# Patient Record
Sex: Male | Born: 1985 | Race: White | Hispanic: No | Marital: Married | Smoking: Never smoker
Health system: Southern US, Community
[De-identification: ages and names within clinical notes are randomized; demographics above are authoritative.]

## PROBLEM LIST (undated history)

## (undated) DIAGNOSIS — G43909 Migraine, unspecified, not intractable, without status migrainosus: Secondary | ICD-10-CM

## (undated) DIAGNOSIS — Z8489 Family history of other specified conditions: Secondary | ICD-10-CM

## (undated) DIAGNOSIS — E109 Type 1 diabetes mellitus without complications: Secondary | ICD-10-CM

## (undated) DIAGNOSIS — F988 Other specified behavioral and emotional disorders with onset usually occurring in childhood and adolescence: Secondary | ICD-10-CM

## (undated) HISTORY — PX: NO PAST SURGERIES: SHX2092

---
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
## 2010-02-13 ENCOUNTER — Emergency Department (HOSPITAL_COMMUNITY)
Admission: EM | Admit: 2010-02-13 | Discharge: 2010-02-14 | Disposition: A | Discharge status: Home / Self Care | Payer: BC Managed Care – PPO | Attending: Emergency Medicine | Admitting: Emergency Medicine

## 2010-02-13 DIAGNOSIS — J3489 Other specified disorders of nose and nasal sinuses: Secondary | ICD-10-CM | POA: Insufficient documentation

## 2010-02-13 DIAGNOSIS — R51 Headache: Secondary | ICD-10-CM | POA: Insufficient documentation

## 2010-02-13 DIAGNOSIS — R110 Nausea: Secondary | ICD-10-CM | POA: Insufficient documentation

## 2010-03-08 ENCOUNTER — Emergency Department (HOSPITAL_COMMUNITY)
Admission: EM | Admit: 2010-03-08 | Discharge: 2010-03-08 | Disposition: A | Discharge status: Home / Self Care | Payer: BC Managed Care – PPO | Attending: Emergency Medicine | Admitting: Emergency Medicine

## 2010-03-08 DIAGNOSIS — R220 Localized swelling, mass and lump, head: Secondary | ICD-10-CM | POA: Insufficient documentation

## 2010-03-08 DIAGNOSIS — T394X5A Adverse effect of antirheumatics, not elsewhere classified, initial encounter: Secondary | ICD-10-CM | POA: Insufficient documentation

## 2010-03-08 DIAGNOSIS — E119 Type 2 diabetes mellitus without complications: Secondary | ICD-10-CM | POA: Insufficient documentation

## 2010-08-08 ENCOUNTER — Encounter: Payer: Self-pay | Admitting: Family Medicine

## 2012-02-21 ENCOUNTER — Other Ambulatory Visit: Payer: Self-pay | Admitting: Physician Assistant

## 2012-02-21 ENCOUNTER — Ambulatory Visit
Admission: RE | Admit: 2012-02-21 | Discharge: 2012-02-21 | Disposition: A | Discharge status: Home / Self Care | Payer: 59 | Source: Ambulatory Visit | Attending: Physician Assistant | Admitting: Physician Assistant

## 2012-02-21 DIAGNOSIS — M79672 Pain in left foot: Secondary | ICD-10-CM

## 2012-02-21 DIAGNOSIS — W19XXXA Unspecified fall, initial encounter: Secondary | ICD-10-CM

## 2012-02-21 DIAGNOSIS — M25572 Pain in left ankle and joints of left foot: Secondary | ICD-10-CM

## 2012-05-22 ENCOUNTER — Other Ambulatory Visit: Payer: Self-pay | Admitting: Family Medicine

## 2012-08-24 ENCOUNTER — Other Ambulatory Visit: Payer: Self-pay | Admitting: Physician Assistant

## 2012-10-30 ENCOUNTER — Telehealth: Payer: Self-pay | Admitting: Family Medicine

## 2012-10-30 NOTE — Telephone Encounter (Signed)
FYI

## 2012-10-30 NOTE — Telephone Encounter (Signed)
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 Cislo.  States that he is not following proper instruction for his Lantis and his FBS. Patient did not understand instructions that were given. FBS are all over the place. Please be aware .

## 2012-11-01 NOTE — Telephone Encounter (Signed)
Send to PCP.

## 2012-11-02 ENCOUNTER — Other Ambulatory Visit: Payer: Self-pay | Admitting: Physician Assistant

## 2012-11-02 NOTE — Telephone Encounter (Signed)
Pt called.  Wants early morning or late afternoon appt.  First available is Tuesday Oct 28 430 pm

## 2012-11-02 NOTE — Telephone Encounter (Signed)
Patient NTBS, bring in sugar readings.

## 2012-11-02 NOTE — Telephone Encounter (Signed)
Has appt 10/28.  One month supply sent

## 2012-11-09 ENCOUNTER — Encounter (HOSPITAL_COMMUNITY): Payer: Self-pay | Admitting: Emergency Medicine

## 2012-11-09 ENCOUNTER — Emergency Department (HOSPITAL_COMMUNITY)
Admission: EM | Admit: 2012-11-09 | Discharge: 2012-11-09 | Disposition: A | Discharge status: Home / Self Care | Payer: Managed Care, Other (non HMO) | Attending: Emergency Medicine | Admitting: Emergency Medicine

## 2012-11-09 DIAGNOSIS — L240 Irritant contact dermatitis due to detergents: Secondary | ICD-10-CM | POA: Insufficient documentation

## 2012-11-09 DIAGNOSIS — E119 Type 2 diabetes mellitus without complications: Secondary | ICD-10-CM | POA: Insufficient documentation

## 2012-11-09 DIAGNOSIS — R21 Rash and other nonspecific skin eruption: Secondary | ICD-10-CM

## 2012-11-09 DIAGNOSIS — Z79899 Other long term (current) drug therapy: Secondary | ICD-10-CM | POA: Insufficient documentation

## 2012-11-09 DIAGNOSIS — T7840XA Allergy, unspecified, initial encounter: Secondary | ICD-10-CM

## 2012-11-09 MED ORDER — PREDNISONE 20 MG PO TABS: ORAL_TABLET | ORAL | Status: DC

## 2012-11-09 MED ORDER — PREDNISONE 50 MG PO TABS
60.0000 mg | ORAL_TABLET | Freq: Once | ORAL | Status: AC
  Administered 2012-11-09: 60 mg via ORAL
  Filled 2012-11-09 (×2): qty 1

## 2012-11-09 NOTE — ED Provider Notes (Signed)
CSN: [id number]     Arrival date & time 11/09/12  2120 History   First MD Initiated Contact with Patient 11/09/12 2133    Scribed for Enid Skeens, MD, the patient was seen in room APA14/APA14. This chart was scribed by Lewanda Rife, ED scribe. Patient's care was started at 9:55 PM  Chief Complaint  Patient presents with  . Allergic Reaction   (Consider location/radiation/quality/duration/timing/severity/associated sxs/prior Treatment) HPI HPI Comments: Clifford Whitney is a 27 y.o. male who presents to the Emergency Department complaining of worsening rash onset last night. Reports associated new soap exposure Denies associated new medications, tick exposure   Past Medical History  Diagnosis Date  . Diabetes mellitus    History reviewed. No pertinent past surgical history. History reviewed. No pertinent family history. History  Substance Use Topics  . Smoking status: Never Smoker   . Smokeless tobacco: Not on file  . Alcohol Use: Yes    Review of Systems  Allergies  Aspirin and Ibuprofen  Home Medications   Current Outpatient Rx  Name  Route  Sig  Dispense  Refill  . glipiZIDE (GLUCOTROL XL) 2.5 MG 24 hr tablet   Oral   Take 2.5 mg by mouth daily.            BP 120/68  Pulse 68  Temp(Src) 97.5 F (36.4 C) (Oral)  Resp 18  Ht 5\' 9"  (1.753 m)  Wt 160 lb (72.576 kg)  BMI 23.62 kg/m2  SpO2 100% Physical Exam  Nursing note and vitals reviewed. Constitutional: He is oriented to person, place, and time. He appears well-developed and well-nourished.  HENT:  Head: Normocephalic and atraumatic.  Mouth/Throat: Oropharynx is clear and moist.  No signs of angioedema, no tongue swelling, no lip swelling, and airway patent   Eyes: Conjunctivae are normal. Right eye exhibits no discharge. Left eye exhibits no discharge.  Neck: Normal range of motion. Neck supple. No tracheal deviation present.  Cardiovascular: Normal rate and regular rhythm.   No murmur  heard. Pulmonary/Chest: Effort normal and breath sounds normal. No respiratory distress. He has no wheezes. He has no rales.  Abdominal: Soft. He exhibits no distension. There is no tenderness. There is no guarding.  Musculoskeletal: He exhibits no edema.  Neurological: He is alert and oriented to person, place, and time.  Skin: Skin is warm. Rash noted. No petechiae and no purpura noted. Rash is maculopapular and urticarial.  Psychiatric: He has a normal mood and affect.    ED Course  Procedures (including critical care time) Labs Review Labs Reviewed  GLUCOSE, CAPILLARY - Abnormal; Notable for the following:    Glucose-Capillary 349 (*)    All other components within normal limits   Imaging Review No results found.  EKG Interpretation   None       MDM   1. Allergic reaction, initial encounter   2. Rash    I personally performed the services described in this documentation, which was scribed in my presence. The recorded information has been reviewed and is accurate.  Well appearing.  No anaphylaxis.   Prednisone/ benadryl prn  Results and differential diagnosis were discussed with the patient. Close follow up outpatient was discussed, patient comfortable with the plan.   Diagnosis: Allergic reaction  Enid Skeens, MD 11/12/12 2241

## 2012-11-09 NOTE — ED Notes (Signed)
Allergic reaction to safeguard soap, Onset last pm. Urticarial rash, No sob. Took benadryl 50 mg 730p

## 2012-11-09 NOTE — ED Notes (Signed)
Total body rash started yesterday, improved after benadryl, then reoccurred this afternoon.

## 2012-11-10 ENCOUNTER — Ambulatory Visit: Payer: Self-pay | Admitting: Family Medicine

## 2012-11-16 ENCOUNTER — Ambulatory Visit (INDEPENDENT_AMBULATORY_CARE_PROVIDER_SITE_OTHER): Payer: Managed Care, Other (non HMO) | Admitting: Family Medicine

## 2012-11-16 ENCOUNTER — Encounter: Payer: Self-pay | Admitting: Family Medicine

## 2012-11-16 VITALS — BP 130/78 | HR 72 | Temp 97.6°F | Resp 16 | Wt 166.0 lb

## 2012-11-16 DIAGNOSIS — IMO0001 Reserved for inherently not codable concepts without codable children: Secondary | ICD-10-CM

## 2012-11-16 NOTE — Progress Notes (Signed)
  Subjective:    Patient ID: Clifford Whitney, male    DOB: [birth year], 27 y.o.   MRN: [id number]  HPI Patient is currently on Lantus 30 units SQ qday for IDDM.  He has not been checking sugars at all until recently.  Reportedly the sugars are ranging <120 in the morning and less than 200 in the evening.  However, he has been in a custody battle and separation so he has not been seriously controlling his sugars until the last few weeks.  He denies hypoglycemia.   Past Medical History  Diagnosis Date  . Diabetes mellitus    Current Outpatient Prescriptions on File Prior to Visit  Medication Sig Dispense Refill  . insulin glargine (LANTUS) 100 UNIT/ML injection Inject 30 Units into the skin every morning.       Marland Kitchen lisinopril (PRINIVIL,ZESTRIL) 10 MG tablet Take 10 mg by mouth daily.      . metFORMIN (GLUCOPHAGE) 1000 MG tablet Take 1,000 mg by mouth 2 (two) times daily.      . pioglitazone (ACTOS) 45 MG tablet Take 45 mg by mouth daily.      . pravastatin (PRAVACHOL) 40 MG tablet Take 40 mg by mouth daily.       No current facility-administered medications on file prior to visit.   Allergies  Allergen Reactions  . Aspirin Swelling    Reaction: Facial swelling  . Ibuprofen Swelling    Reaction: Facial swelling   History   Social History  . Marital Status: Legally Separated    Spouse Name: N/A    Number of Children: N/A  . Years of Education: N/A   Occupational History  . Not on file.   Social History Main Topics  . Smoking status: Never Smoker   . Smokeless tobacco: Not on file  . Alcohol Use: Yes  . Drug Use: No  . Sexual Activity: Not on file   Other Topics Concern  . Not on file   Social History Narrative  . No narrative on file      Review of Systems  All other systems reviewed and are negative.       Objective:   Physical Exam  Vitals reviewed. Cardiovascular: Normal rate, regular rhythm and normal heart sounds.   No murmur heard. Pulmonary/Chest: Effort  normal and breath sounds normal. No respiratory distress. He has no wheezes. He has no rales. He exhibits no tenderness.  Abdominal: Soft. Bowel sounds are normal. He exhibits no distension. There is no tenderness. There is no rebound and no guarding.          Assessment & Plan:  1. Type II or unspecified type diabetes mellitus without mention of complication, uncontrolled Check HgA1c.  Goal is less than 7.  He is to check FBS and 2 hr PPS for the next week and then allow me to review.  I will likely add novolog 7 units with supper/meals depending on what his 2 hr pps are especially if his FBS are less than 120. - COMPLETE METABOLIC PANEL WITH GFR - Hemoglobin A1c

## 2012-11-17 LAB — COMPLETE METABOLIC PANEL WITH GFR
Albumin: 4.5 g/dL (ref 3.5–5.2)
Alkaline Phosphatase: 60 U/L (ref 39–117)
BUN: 17 mg/dL (ref 6–23)
Calcium: 10.5 mg/dL (ref 8.4–10.5)
Creat: 0.74 mg/dL (ref 0.50–1.35)
GFR, Est Non African American: >89 mL/min
Glucose, Bld: 173 mg/dL — ABNORMAL HIGH (ref 70–99)
Potassium: 4.8 mEq/L (ref 3.5–5.3)
Sodium: 134 mEq/L — ABNORMAL LOW (ref 135–145)
Total Protein: 7.4 g/dL (ref 6.0–8.3)

## 2012-11-17 LAB — HEMOGLOBIN A1C
Hgb A1c MFr Bld: 12.9 % — ABNORMAL HIGH (ref <5.7)
Mean Plasma Glucose: 324 mg/dL — ABNORMAL HIGH (ref <117)

## 2012-11-19 ENCOUNTER — Other Ambulatory Visit: Payer: Self-pay | Admitting: Family Medicine

## 2012-11-24 ENCOUNTER — Other Ambulatory Visit: Payer: Self-pay | Admitting: Physician Assistant

## 2013-01-12 ENCOUNTER — Telehealth: Payer: Self-pay | Admitting: Family Medicine

## 2013-01-12 NOTE — Telephone Encounter (Signed)
Pt is wanting a meter for his  blood sugar (glucometer) [facility] on [address] Call back number is [phone]

## 2013-01-13 MED ORDER — ONETOUCH ULTRA 2 W/DEVICE KIT: PACK | Status: DC

## 2013-01-13 NOTE — Telephone Encounter (Signed)
Sent in rx for new meter and supplies

## 2013-02-18 ENCOUNTER — Other Ambulatory Visit: Payer: Self-pay | Admitting: Physician Assistant

## 2013-02-19 ENCOUNTER — Encounter: Payer: Self-pay | Admitting: Family Medicine

## 2013-02-19 NOTE — Telephone Encounter (Signed)
Medication refill for one time only.  Patient needs to be seen.  Letter sent for patient to call and schedule 

## 2013-03-08 ENCOUNTER — Other Ambulatory Visit: Payer: Self-pay | Admitting: Family Medicine

## 2013-03-08 MED ORDER — METFORMIN HCL 1000 MG PO TABS: 1000.0000 mg | ORAL_TABLET | Freq: Two times a day (BID) | ORAL | Status: DC

## 2013-03-08 NOTE — Telephone Encounter (Signed)
Rx Refilled  

## 2013-04-29 ENCOUNTER — Other Ambulatory Visit: Payer: Self-pay | Admitting: Family Medicine

## 2013-04-29 MED ORDER — LISINOPRIL 10 MG PO TABS: ORAL_TABLET | ORAL | Status: DC

## 2013-04-29 NOTE — Telephone Encounter (Signed)
Rx Refilled  

## 2013-05-01 ENCOUNTER — Other Ambulatory Visit: Payer: Self-pay | Admitting: Family Medicine

## 2013-06-14 ENCOUNTER — Other Ambulatory Visit: Payer: Self-pay | Admitting: Family Medicine

## 2013-06-14 ENCOUNTER — Encounter: Payer: Self-pay | Admitting: Family Medicine

## 2013-06-14 MED ORDER — PIOGLITAZONE HCL 45 MG PO TABS: 45.0000 mg | ORAL_TABLET | Freq: Every day | ORAL | Status: DC

## 2013-06-14 MED ORDER — LISINOPRIL 10 MG PO TABS: 10.0000 mg | ORAL_TABLET | Freq: Every day | ORAL | Status: DC

## 2013-06-14 MED ORDER — PRAVASTATIN SODIUM 40 MG PO TABS: 40.0000 mg | ORAL_TABLET | Freq: Every day | ORAL | Status: DC

## 2013-06-14 NOTE — Telephone Encounter (Signed)
Rx Refilled and will send letter to schedule appt 

## 2013-08-10 ENCOUNTER — Inpatient Hospital Stay (HOSPITAL_COMMUNITY): Payer: Managed Care, Other (non HMO)

## 2013-08-10 ENCOUNTER — Ambulatory Visit (INDEPENDENT_AMBULATORY_CARE_PROVIDER_SITE_OTHER): Payer: Managed Care, Other (non HMO) | Admitting: Family Medicine

## 2013-08-10 ENCOUNTER — Encounter: Payer: Self-pay | Admitting: Family Medicine

## 2013-08-10 ENCOUNTER — Inpatient Hospital Stay (HOSPITAL_COMMUNITY)
Admission: EM | Admit: 2013-08-10 | Discharge: 2013-08-13 | DRG: 638 | Disposition: A | Discharge status: Home / Self Care | Payer: Managed Care, Other (non HMO) | Attending: Internal Medicine | Admitting: Internal Medicine

## 2013-08-10 ENCOUNTER — Encounter (HOSPITAL_COMMUNITY): Payer: Self-pay | Admitting: Emergency Medicine

## 2013-08-10 VITALS — BP 100/76 | HR 100 | Temp 96.7°F | Resp 16 | Ht 69.0 in | Wt 145.0 lb

## 2013-08-10 DIAGNOSIS — E872 Acidosis, unspecified: Secondary | ICD-10-CM | POA: Diagnosis present

## 2013-08-10 DIAGNOSIS — E1165 Type 2 diabetes mellitus with hyperglycemia: Secondary | ICD-10-CM

## 2013-08-10 DIAGNOSIS — E0810 Diabetes mellitus due to underlying condition with ketoacidosis without coma: Secondary | ICD-10-CM

## 2013-08-10 DIAGNOSIS — E1110 Type 2 diabetes mellitus with ketoacidosis without coma: Secondary | ICD-10-CM | POA: Diagnosis present

## 2013-08-10 DIAGNOSIS — E1310 Other specified diabetes mellitus with ketoacidosis without coma: Secondary | ICD-10-CM

## 2013-08-10 DIAGNOSIS — Z23 Encounter for immunization: Secondary | ICD-10-CM

## 2013-08-10 DIAGNOSIS — IMO0002 Reserved for concepts with insufficient information to code with codable children: Secondary | ICD-10-CM

## 2013-08-10 DIAGNOSIS — Z91199 Patient's noncompliance with other medical treatment and regimen due to unspecified reason: Secondary | ICD-10-CM

## 2013-08-10 DIAGNOSIS — E1069 Type 1 diabetes mellitus with other specified complication: Secondary | ICD-10-CM

## 2013-08-10 DIAGNOSIS — E1065 Type 1 diabetes mellitus with hyperglycemia: Secondary | ICD-10-CM

## 2013-08-10 DIAGNOSIS — E1010 Type 1 diabetes mellitus with ketoacidosis without coma: Principal | ICD-10-CM | POA: Diagnosis present

## 2013-08-10 DIAGNOSIS — Z794 Long term (current) use of insulin: Principal | ICD-10-CM

## 2013-08-10 DIAGNOSIS — R634 Abnormal weight loss: Secondary | ICD-10-CM | POA: Diagnosis present

## 2013-08-10 DIAGNOSIS — E1011 Type 1 diabetes mellitus with ketoacidosis with coma: Secondary | ICD-10-CM

## 2013-08-10 DIAGNOSIS — Z9119 Patient's noncompliance with other medical treatment and regimen: Secondary | ICD-10-CM

## 2013-08-10 DIAGNOSIS — E860 Dehydration: Secondary | ICD-10-CM

## 2013-08-10 DIAGNOSIS — IMO0001 Reserved for inherently not codable concepts without codable children: Secondary | ICD-10-CM

## 2013-08-10 HISTORY — DX: Family history of other specified conditions: Z84.89

## 2013-08-10 HISTORY — DX: Migraine, unspecified, not intractable, without status migrainosus: G43.909

## 2013-08-10 HISTORY — DX: Type 1 diabetes mellitus without complications: E10.9

## 2013-08-10 HISTORY — DX: Other specified behavioral and emotional disorders with onset usually occurring in childhood and adolescence: F98.8

## 2013-08-10 LAB — CBC WITH DIFFERENTIAL/PLATELET
BASOS PCT: 0 % (ref 0–1)
Basophils Absolute: 0 10*3/uL (ref 0.0–0.1)
EOS ABS: 0 10*3/uL (ref 0.0–0.7)
EOS PCT: 0 % (ref 0–5)
HEMATOCRIT: 48.6 % (ref 39.0–52.0)
Hemoglobin: 16.9 g/dL (ref 13.0–17.0)
Lymphocytes Relative: 17 % (ref 12–46)
Lymphs Abs: 1.6 10*3/uL (ref 0.7–4.0)
MCH: 29.9 pg (ref 26.0–34.0)
MCHC: 34.8 g/dL (ref 30.0–36.0)
MCV: 86 fL (ref 78.0–100.0)
MONO ABS: 0.5 10*3/uL (ref 0.1–1.0)
Monocytes Relative: 6 % (ref 3–12)
Neutro Abs: 7.1 10*3/uL (ref 1.7–7.7)
Neutrophils Relative %: 77 % (ref 43–77)
Platelets: 354 10*3/uL (ref 150–400)
RBC: 5.65 MIL/uL (ref 4.22–5.81)
RDW: 12.8 % (ref 11.5–15.5)
WBC: 9.3 10*3/uL (ref 4.0–10.5)

## 2013-08-10 LAB — CBG MONITORING, ED
Glucose-Capillary: 249 mg/dL — ABNORMAL HIGH (ref 70–99)
Glucose-Capillary: 276 mg/dL — ABNORMAL HIGH (ref 70–99)

## 2013-08-10 LAB — COMPREHENSIVE METABOLIC PANEL
ALBUMIN: 4 g/dL (ref 3.5–5.2)
ALT: 12 U/L (ref 0–53)
ANION GAP: 30 — AB (ref 5–15)
AST: 11 U/L (ref 0–37)
Alkaline Phosphatase: 102 U/L (ref 39–117)
BUN: 11 mg/dL (ref 6–23)
CO2: 8 mEq/L — CL (ref 19–32)
CREATININE: 0.6 mg/dL (ref 0.50–1.35)
Calcium: 8.5 mg/dL (ref 8.4–10.5)
Chloride: 96 mEq/L (ref 96–112)
GFR calc non Af Amer: >90 mL/min (ref >90)
Glucose, Bld: 291 mg/dL — ABNORMAL HIGH (ref 70–99)
Potassium: 4.3 mEq/L (ref 3.7–5.3)
Sodium: 134 mEq/L — ABNORMAL LOW (ref 137–147)
TOTAL PROTEIN: 7.8 g/dL (ref 6.0–8.3)
Total Bilirubin: 0.3 mg/dL (ref 0.3–1.2)

## 2013-08-10 LAB — BASIC METABOLIC PANEL
Anion gap: 30 — ABNORMAL HIGH (ref 5–15)
Anion gap: 22 — ABNORMAL HIGH (ref 5–15)
BUN: 10 mg/dL (ref 6–23)
BUN: 9 mg/dL (ref 6–23)
CALCIUM: 8.4 mg/dL (ref 8.4–10.5)
CHLORIDE: 100 meq/L (ref 96–112)
CO2: 7 mEq/L — CL (ref 19–32)
CO2: 10 meq/L — AB (ref 19–32)
CREATININE: 0.58 mg/dL (ref 0.50–1.35)
Calcium: 8.2 mg/dL — ABNORMAL LOW (ref 8.4–10.5)
Chloride: 97 mEq/L (ref 96–112)
Creatinine, Ser: 0.69 mg/dL (ref 0.50–1.35)
GFR calc Af Amer: >90 mL/min (ref >90)
GFR calc Af Amer: >90 mL/min (ref >90)
GLUCOSE: 273 mg/dL — AB (ref 70–99)
GLUCOSE: 250 mg/dL — AB (ref 70–99)
Potassium: 4.4 mEq/L (ref 3.7–5.3)
Potassium: 3.9 mEq/L (ref 3.7–5.3)
SODIUM: 134 meq/L — AB (ref 137–147)
SODIUM: 132 meq/L — AB (ref 137–147)

## 2013-08-10 LAB — I-STAT ARTERIAL BLOOD GAS, ED
ACID-BASE DEFICIT: 21 mmol/L — AB (ref 0.0–2.0)
Bicarbonate: 6.1 mEq/L — ABNORMAL LOW (ref 20.0–24.0)
O2 Saturation: 98 %
TCO2: 7 mmol/L (ref 0–100)
pCO2 arterial: 17.9 mmHg — CL (ref 35.0–45.0)
pH, Arterial: 7.141 — CL (ref 7.350–7.450)
pO2, Arterial: 122 mmHg — ABNORMAL HIGH (ref 80.0–100.0)

## 2013-08-10 LAB — GLUCOSE, CAPILLARY
GLUCOSE-CAPILLARY: 208 mg/dL — AB (ref 70–99)
Glucose-Capillary: 236 mg/dL — ABNORMAL HIGH (ref 70–99)
Glucose-Capillary: 242 mg/dL — ABNORMAL HIGH (ref 70–99)

## 2013-08-10 LAB — URINE MICROSCOPIC-ADD ON

## 2013-08-10 LAB — RAPID URINE DRUG SCREEN, HOSP PERFORMED
Amphetamines: NOT DETECTED
BARBITURATES: NOT DETECTED
BENZODIAZEPINES: NOT DETECTED
COCAINE: NOT DETECTED
OPIATES: NOT DETECTED
TETRAHYDROCANNABINOL: NOT DETECTED

## 2013-08-10 LAB — GLUCOSE, FINGERSTICK (STAT): GLUCOSE, FINGERSTICK: 236 mg/dL — AB (ref 70–99)

## 2013-08-10 LAB — URINALYSIS, ROUTINE W REFLEX MICROSCOPIC
Bilirubin Urine: NEGATIVE
Ketones, ur: >80 mg/dL — AB
LEUKOCYTES UA: NEGATIVE
NITRITE: NEGATIVE
PROTEIN: 100 mg/dL — AB
Specific Gravity, Urine: 1.026 (ref 1.005–1.030)
UROBILINOGEN UA: 0.2 mg/dL (ref 0.0–1.0)
pH: 5 (ref 5.0–8.0)

## 2013-08-10 LAB — KETONES, QUALITATIVE

## 2013-08-10 LAB — HEMOGLOBIN A1C, FINGERSTICK: Hgb A1C (fingerstick): >14 % — ABNORMAL HIGH (ref <5.7)

## 2013-08-10 LAB — I-STAT CG4 LACTIC ACID, ED: LACTIC ACID, VENOUS: 1.44 mmol/L (ref 0.5–2.2)

## 2013-08-10 LAB — LIPASE, BLOOD: Lipase: 37 U/L (ref 11–59)

## 2013-08-10 LAB — MRSA PCR SCREENING: MRSA BY PCR: NEGATIVE

## 2013-08-10 MED ORDER — SODIUM CHLORIDE 0.9 % IV SOLN
Freq: Once | INTRAVENOUS | Status: AC
  Administered 2013-08-10: 18:00:00 via INTRAVENOUS

## 2013-08-10 MED ORDER — DEXTROSE 5 % IV SOLN
INTRAVENOUS | Status: DC
  Filled 2013-08-10 (×2): qty 100

## 2013-08-10 MED ORDER — ACETAMINOPHEN 650 MG RE SUPP: 650.0000 mg | Freq: Four times a day (QID) | RECTAL | Status: DC | PRN

## 2013-08-10 MED ORDER — SODIUM CHLORIDE 0.9 % IV SOLN
INTRAVENOUS | Status: DC
  Administered 2013-08-10: 20:00:00 via INTRAVENOUS

## 2013-08-10 MED ORDER — INSULIN REGULAR HUMAN 100 UNIT/ML IJ SOLN
INTRAMUSCULAR | Status: DC
  Administered 2013-08-10: 2.2 [IU]/h via INTRAVENOUS
  Administered 2013-08-11: 2.5 [IU]/h via INTRAVENOUS
  Administered 2013-08-11: 2.2 [IU]/h via INTRAVENOUS
  Administered 2013-08-11 (×2): 2.7 [IU]/h via INTRAVENOUS
  Administered 2013-08-11: 3.9 [IU]/h via INTRAVENOUS
  Administered 2013-08-11: 7.3 [IU]/h via INTRAVENOUS
  Administered 2013-08-11: 5.8 [IU]/h via INTRAVENOUS
  Administered 2013-08-11: 1.4 [IU]/h via INTRAVENOUS
  Administered 2013-08-11: 4.9 [IU]/h via INTRAVENOUS
  Administered 2013-08-11: 2.2 [IU]/h via INTRAVENOUS
  Administered 2013-08-12: 1.8 [IU]/h via INTRAVENOUS
  Administered 2013-08-12: 3.8 [IU]/h via INTRAVENOUS
  Filled 2013-08-10 (×2): qty 1

## 2013-08-10 MED ORDER — ENOXAPARIN SODIUM 40 MG/0.4ML ~~LOC~~ SOLN
40.0000 mg | SUBCUTANEOUS | Status: DC
  Administered 2013-08-10 – 2013-08-12 (×3): 40 mg via SUBCUTANEOUS
  Filled 2013-08-10 (×4): qty 0.4

## 2013-08-10 MED ORDER — ONDANSETRON HCL 4 MG/2ML IJ SOLN
4.0000 mg | Freq: Four times a day (QID) | INTRAMUSCULAR | Status: DC | PRN
  Administered 2013-08-10: 4 mg via INTRAVENOUS
  Filled 2013-08-10: qty 2

## 2013-08-10 MED ORDER — ACETAMINOPHEN 325 MG PO TABS: 650.0000 mg | ORAL_TABLET | Freq: Four times a day (QID) | ORAL | Status: DC | PRN

## 2013-08-10 MED ORDER — ONDANSETRON HCL 4 MG/2ML IJ SOLN
4.0000 mg | Freq: Once | INTRAMUSCULAR | Status: AC
  Administered 2013-08-10: 4 mg via INTRAVENOUS
  Filled 2013-08-10: qty 2

## 2013-08-10 MED ORDER — DEXTROSE-NACL 5-0.45 % IV SOLN
INTRAVENOUS | Status: DC
  Administered 2013-08-10 – 2013-08-12 (×5): via INTRAVENOUS

## 2013-08-10 MED ORDER — SODIUM CHLORIDE 0.9 % IV BOLUS (SEPSIS)
2000.0000 mL | Freq: Once | INTRAVENOUS | Status: AC
  Administered 2013-08-10: 2000 mL via INTRAVENOUS

## 2013-08-10 MED ORDER — PNEUMOCOCCAL VAC POLYVALENT 25 MCG/0.5ML IJ INJ
0.5000 mL | INJECTION | INTRAMUSCULAR | Status: AC
  Administered 2013-08-11: 0.5 mL via INTRAMUSCULAR
  Filled 2013-08-10: qty 0.5

## 2013-08-10 MED ORDER — ONDANSETRON HCL 4 MG PO TABS: 4.0000 mg | ORAL_TABLET | Freq: Four times a day (QID) | ORAL | Status: DC | PRN

## 2013-08-10 MED ORDER — SODIUM CHLORIDE 0.9 % IV SOLN
INTRAVENOUS | Status: DC
  Administered 2013-08-10 – 2013-08-11 (×2): via INTRAVENOUS

## 2013-08-10 NOTE — Consult Note (Signed)
PULMONARY / CRITICAL CARE MEDICINE   Name: Clifford Whitney MRN: [id number] DOB: 12/28/1985    ADMISSION DATE:  08/10/2013 CONSULTATION DATE:  08/10/2013  REFERRING MD :  EDP  CHIEF COMPLAINT:  Malaise  INITIAL PRESENTATION: 28 year old male with DM presented to [facility] ED 7/28 with nausea, dizziness, and weight loss. He has been unable to take DM medications due to financial reasons. Found to have elevated glucose and urine ketones were >80. PCCM asked to see.    STUDIES:    SIGNIFICANT EVENTS: 7/28 Admitted for DKA   HISTORY OF PRESENT ILLNESS:  28 year old male with PMh significant for DM. For financial reasons he has been unable to obtain all of his medications for at least 2 weeks. Metformin is the only medication he has been able to keep taking.  He presented to PCP 7/28 c/o dizziness and nausea. PCP notes that there has been about a 25 lb weight loss over the past 2 weeks. He was sent to ED where his glucose is found to be 291 with positive urine ketones. ABG revealed acidosis. He was bolused with 2L NS and placed on insulin gtt. Maintenance fluids to D5 as CBG below 250 by my time of arrival.  PCCM asked to consult.   PAST MEDICAL HISTORY :  Past Medical History  Diagnosis Date  . Diabetes mellitus    History reviewed. No pertinent past surgical history. Prior to Admission medications   Medication Sig Start Date End Date Taking? Authorizing Provider  insulin glargine (LANTUS) 100 UNIT/ML injection Inject 30 Units into the skin every morning.    Yes Historical Provider, MD  lisinopril (PRINIVIL,ZESTRIL) 10 MG tablet Take 1 tablet (10 mg total) by mouth daily. 06/14/13  Yes Susy Frizzle, MD  metFORMIN (GLUCOPHAGE) 1000 MG tablet Take 1 tablet (1,000 mg total) by mouth 2 (two) times daily. 03/08/13  Yes Susy Frizzle, MD  pioglitazone (ACTOS) 45 MG tablet Take 1 tablet (45 mg total) by mouth daily. 06/14/13  Yes Susy Frizzle, MD  pravastatin (PRAVACHOL) 40 MG tablet Take 1 tablet  (40 mg total) by mouth daily. 06/14/13  Yes Susy Frizzle, MD  amoxicillin-clavulanate (AUGMENTIN) 875-125 MG per tablet Take 1 tablet by mouth 2 (two) times daily. Starting 7/10 for 10 days 07/23/13   Historical Provider, MD  B-D INS SYR ULTRAFINE .3CC/30G 30G X 1/2" 0.3 ML MISC USE ONCE A DAY. 11/19/12   Susy Frizzle, MD  Blood Glucose Monitoring Suppl (ONE TOUCH ULTRA 2) W/DEVICE KIT Pt will need strips for checking BS tid (Disp. 100/5 refills) and lancets. (1 box/5refills) Dx - 250.00 - 01/13/13   Susy Frizzle, MD   Allergies  Allergen Reactions  . Aspirin Swelling    Reaction: Facial swelling  . Ibuprofen Swelling    Reaction: Facial swelling    FAMILY HISTORY:  History reviewed. No pertinent family history. SOCIAL HISTORY:  reports that he has never smoked. He does not have any smokeless tobacco history on file. He reports that he drinks alcohol. He reports that he does not use illicit drugs.  REVIEW OF SYSTEMS:     Bolds are positive  Constitutional: weight loss, gain, night sweats, Fevers, chills, fatigue .  HEENT: headaches, Sore throat, sneezing, nasal congestion, post nasal drip, Difficulty swallowing, Tooth/dental problems, visual complaints visual changes, ear ache CV:  chest pain, radiates: ,Orthopnea, PND, swelling in lower extremities, dizziness, palpitations, syncope.  GI  heartburn, indigestion, abdominal pain, nausea, vomiting, diarrhea,  change in bowel habits, loss of appetite, bloody stools.  Resp: cough, productive: , hemoptysis, dyspnea, chest pain, pleuritic.  Skin: rash or itching or icterus GU: dysuria, change in color of urine, urgency or frequency. flank pain, hematuria  MS: joint pain or swelling. decreased range of motion  Psych: change in mood or affect. depression or anxiety.  Neuro: difficulty with speech, weakness, numbness, ataxia    SUBJECTIVE:   VITAL SIGNS: Temp:  [96.7 F (35.9 C)] 96.7 F (35.9 C) (07/28 1020) Pulse Rate:  [88-109]  96 (07/28 1657) Resp:  [16-20] 16 (07/28 1657) BP: (100-133)/(65-79) 119/71 mmHg (07/28 1657) SpO2:  [98 %-100 %] 98 % (07/28 1657) Weight:  [65.772 kg (145 lb)] 65.772 kg (145 lb) (07/28 1113) HEMODYNAMICS:   VENTILATOR SETTINGS:   INTAKE / OUTPUT: No intake or output data in the 24 hours ending 08/10/13 1712  PHYSICAL EXAMINATION: General:  Young male of normal body habitus in NAD Neuro:  Alert, oriented HEENT:  Hood/AT, flushed face, PERRL, no JVD noted.  Cardiovascular:  RRR, S1 S2 intact. No MRG Lungs:  Clear bilateral sounds. Unlabored Abdomen:  Soft, non-tender, non-distended Musculoskeletal:  No acute deformity or ROM limitation.  Skin:  Intact.   LABS:  CBC  Recent Labs Lab 08/10/13 1305  WBC 9.3  HGB 16.9  HCT 48.6  PLT 354   Coag's No results found for this basename: APTT, INR,  in the last 168 hours BMET  Recent Labs Lab 08/10/13 1045 08/10/13 1305  NA  --  134*  K  --  4.3  CL  --  96  CO2  --  8*  BUN  --  11  CREATININE  --  0.60  GLUCOSE 236* 291*   Electrolytes  Recent Labs Lab 08/10/13 1305  CALCIUM 8.5   Sepsis Markers  Recent Labs Lab 08/10/13 1242  LATICACIDVEN 1.44   ABG  Recent Labs Lab 08/10/13 1537  PHART 7.141*  PCO2ART 17.9*  PO2ART 122.0*   Liver Enzymes  Recent Labs Lab 08/10/13 1305  AST 11  ALT 12  ALKPHOS 102  BILITOT 0.3  ALBUMIN 4.0   Cardiac Enzymes No results found for this basename: TROPONINI, PROBNP,  in the last 168 hours Glucose  Recent Labs Lab 08/10/13 1516  GLUCAP 249*    Imaging No results found.   ASSESSMENT / PLAN:  Diabetic Ketoacidosis AODM, onset 3 y ago, quickly turned into IDDM - Patient is appropriate for management by hospitalist team in SDU. - Insulin and maintenance fluids per DKA protocol/Glucostabilizer , change fluids to D5NS when CBG <250, ct insulin gtt until AG closes -would dc bicarb gtt. - K supplementation per DKA protocol.  - Hold metformin in  setting of acidosis. - Consider consult to diabetes management/social work as he needs assistance obtaining medications - Consider outpatient follow up with endocrinologist given rapid progression to IDDM -UDS  PCCM will sign off, please re-consult if needed.   Georgann Housekeeper, ACNP [facility] Pager [phone] or [phone]  Independently examined pt, evaluated data & formulated above care plan with NP who scribed this note & edited by me.   Rigoberto Noel  MD

## 2013-08-10 NOTE — Progress Notes (Signed)
Subjective:    Patient ID: Clifford Whitney, male    DOB: 1985/05/30, 28 y.o.   MRN: [id number]  HPI Patient has a history of adult onset diabetes mellitus type 2 and quickly became insulin-dependent.  Unfortunately, due to cost, the patient has been off his insulin for more than a week. He is been off his Actos for several months. Over the last 2 weeks he is rapidly lost almost 25 pounds.  He is unable to keep any solids down been able to keep sips of liquids down. He appears extremely dehydrated.  He is extremely dizzy upon standing. His mucous membranes are dry. He states he felt he could pass out whenever he stands wall. His blood pressures extremely low and his heart rate is elevated. He is still taking his metformin. He also reports some abdominal pain. I am concerned about acidosis and dehydration secondary to uncontrolled insulin-dependent diabetes mellitus.  Random blood sugar today in the office and surprisingly low at 236.  However his hemoglobin A1c is greater than 14.0 Past Medical History  Diagnosis Date  . Diabetes mellitus    Current Outpatient Prescriptions on File Prior to Visit  Medication Sig Dispense Refill  . metFORMIN (GLUCOPHAGE) 1000 MG tablet Take 1 tablet (1,000 mg total) by mouth 2 (two) times daily.  180 tablet  1  . B-D INS SYR ULTRAFINE .3CC/30G 30G X 1/2" 0.3 ML MISC USE ONCE A DAY.  100 each  5  . Blood Glucose Monitoring Suppl (ONE TOUCH ULTRA 2) W/DEVICE KIT Pt will need strips for checking BS tid (Disp. 100/5 refills) and lancets. (1 box/5refills) Dx - 250.00 -  1 each  0  . insulin glargine (LANTUS) 100 UNIT/ML injection Inject 30 Units into the skin every morning.       Marland Kitchen lisinopril (PRINIVIL,ZESTRIL) 10 MG tablet Take 1 tablet (10 mg total) by mouth daily.  30 tablet  0  . pioglitazone (ACTOS) 45 MG tablet Take 1 tablet (45 mg total) by mouth daily.  30 tablet  0  . pravastatin (PRAVACHOL) 40 MG tablet Take 1 tablet (40 mg total) by mouth daily.  30 tablet  0    No current facility-administered medications on file prior to visit.   Allergies  Allergen Reactions  . Aspirin Swelling    Reaction: Facial swelling  . Ibuprofen Swelling    Reaction: Facial swelling   History   Social History  . Marital Status: Legally Separated    Spouse Name: N/A    Number of Children: N/A  . Years of Education: N/A   Occupational History  . Not on file.   Social History Main Topics  . Smoking status: Never Smoker   . Smokeless tobacco: Not on file  . Alcohol Use: Yes  . Drug Use: No  . Sexual Activity: Not on file   Other Topics Concern  . Not on file   Social History Narrative  . No narrative on file      Review of Systems  All other systems reviewed and are negative.      Objective:   Physical Exam  Constitutional: He appears lethargic. He appears cachectic. He has a sickly appearance.  HENT:  Mouth/Throat: Mucous membranes are dry.  Eyes: Conjunctivae are normal. No scleral icterus.  Cardiovascular: Normal rate and normal heart sounds.   No murmur heard. Pulmonary/Chest: Effort normal and breath sounds normal. No respiratory distress. He has no wheezes. He has no rales.  Abdominal: Soft. Bowel sounds  are normal. He exhibits no distension. There is no tenderness. There is no rebound.  Neurological: He appears lethargic.          Assessment & Plan:  Insulin dependent type 2 diabetes mellitus, uncontrolled  Dehydration   At a minimum, the patient is extremely dehydrated. He has lost almost 20 pounds in 2 weeks. I believe he needs evaluation in the emergency room for aggressive IV fluid rehydration and also immediate lab work to rule out acidosis/possible ketoacidosis. I recommended he go immediately to the emergency room. If the patient is able to be discharged from the emergency room after IV fluids I have also the refilled the patient's Actos as well as Lantus 30 units subcutaneous daily which I would have the patient resume  immediately. However I do believe he will require admission to the hospital, aggressive IV fluid rehydration, and likely changes in his baseline medication

## 2013-08-10 NOTE — ED Notes (Addendum)
Reports being sent here from pcp office for dehydration and weight loss. Having n/v/d since yesterday. cbg was 236 at dr office.

## 2013-08-10 NOTE — ED Notes (Signed)
CRITICAL VALUE ALERT  Critical value received:  CO2 - 8   Date of notification:  08/10/13  Time of notification:  1425  Critical value read back: yes  Nurse who received alert:  Ria BushMelissa Sahra Converse, RN  MD notified (1st page):  Dr. Blinda LeatherwoodPollina  Time of first page:  1226  MD notified (2nd page):  Time of second page:  Responding MD:  Dr. Blinda LeatherwoodPollina  Time MD responded:  1226

## 2013-08-10 NOTE — ED Provider Notes (Signed)
CSN: [id number]     Arrival date & time 08/10/13  1103 History   First MD Initiated Contact with Patient 08/10/13 1116     Chief Complaint  Patient presents with  . Dehydration  . Emesis  . Diarrhea     (Consider location/radiation/quality/duration/timing/severity/associated sxs/prior Treatment) HPI Comments: Patient referred to emergency department by primary care doctor. Patient reports that he has been sick since yesterday with nausea, vomiting. Patient has not had any fever. He denies chest pain, shortness of breath, abdominal pain with the vomiting. He has been out of all of his medications other than the Glucophage. His blood sugars have been running in the mid 200 range.  Patient was seen at the primary care doctor's office today. PCP was concerned about the patient was dehydrated, sent into the emergency department for evaluation and treatment.  Patient is a 28 y.o. male presenting with vomiting and diarrhea.  Emesis Associated symptoms: diarrhea   Associated symptoms: no abdominal pain   Diarrhea Associated symptoms: vomiting   Associated symptoms: no abdominal pain     Past Medical History  Diagnosis Date  . Diabetes mellitus    History reviewed. No pertinent past surgical history. History reviewed. No pertinent family history. History  Substance Use Topics  . Smoking status: Never Smoker   . Smokeless tobacco: Not on file  . Alcohol Use: Yes    Review of Systems  Respiratory: Negative for shortness of breath.   Cardiovascular: Negative for chest pain.  Gastrointestinal: Positive for vomiting and diarrhea. Negative for abdominal pain.  All other systems reviewed and are negative.     Allergies  Aspirin and Ibuprofen  Home Medications   Prior to Admission medications   Medication Sig Start Date End Date Taking? Authorizing Provider  insulin glargine (LANTUS) 100 UNIT/ML injection Inject 30 Units into the skin every morning.    Yes Historical Provider, MD   lisinopril (PRINIVIL,ZESTRIL) 10 MG tablet Take 1 tablet (10 mg total) by mouth daily. 06/14/13  Yes Susy Frizzle, MD  metFORMIN (GLUCOPHAGE) 1000 MG tablet Take 1 tablet (1,000 mg total) by mouth 2 (two) times daily. 03/08/13  Yes Susy Frizzle, MD  pioglitazone (ACTOS) 45 MG tablet Take 1 tablet (45 mg total) by mouth daily. 06/14/13  Yes Susy Frizzle, MD  pravastatin (PRAVACHOL) 40 MG tablet Take 1 tablet (40 mg total) by mouth daily. 06/14/13  Yes Susy Frizzle, MD  amoxicillin-clavulanate (AUGMENTIN) 875-125 MG per tablet Take 1 tablet by mouth 2 (two) times daily. Starting 7/10 for 10 days 07/23/13   Historical Provider, MD  B-D INS SYR ULTRAFINE .3CC/30G 30G X 1/2" 0.3 ML MISC USE ONCE A DAY. 11/19/12   Susy Frizzle, MD  Blood Glucose Monitoring Suppl (ONE TOUCH ULTRA 2) W/DEVICE KIT Pt will need strips for checking BS tid (Disp. 100/5 refills) and lancets. (1 box/5refills) Dx - 250.00 - 01/13/13   Susy Frizzle, MD   BP 119/71  Pulse 96  Resp 16  Ht 5' 9"  (1.753 m)  Wt 145 lb (65.772 kg)  BMI 21.40 kg/m2  SpO2 98% Physical Exam  Constitutional: He is oriented to person, place, and time. He appears well-developed and well-nourished. No distress.  HENT:  Head: Normocephalic and atraumatic.  Right Ear: Hearing normal.  Left Ear: Hearing normal.  Nose: Nose normal.  Mouth/Throat: Oropharynx is clear and moist. Mucous membranes are dry.  Eyes: Conjunctivae and EOM are normal. Pupils are equal, round, and reactive to light.  Neck: Normal range of motion. Neck supple.  Cardiovascular: Regular rhythm, S1 normal and S2 normal.  Exam reveals no gallop and no friction rub.   No murmur heard. Pulmonary/Chest: Effort normal and breath sounds normal. No respiratory distress. He exhibits no tenderness.  Abdominal: Soft. Normal appearance and bowel sounds are normal. There is no hepatosplenomegaly. There is no tenderness. There is no rebound, no guarding, no tenderness at McBurney's  point and negative Murphy's sign. No hernia.  Musculoskeletal: Normal range of motion.  Neurological: He is alert and oriented to person, place, and time. He has normal strength. No cranial nerve deficit or sensory deficit. Coordination normal. GCS eye subscore is 4. GCS verbal subscore is 5. GCS motor subscore is 6.  Skin: Skin is warm, dry and intact. No rash noted. No cyanosis.  Psychiatric: He has a normal mood and affect. His speech is normal and behavior is normal. Thought content normal.    ED Course  Procedures (including critical care time) Labs Review Labs Reviewed  URINALYSIS, ROUTINE W REFLEX MICROSCOPIC - Abnormal; Notable for the following:    Glucose, UA >1000 (*)    Hgb urine dipstick SMALL (*)    Ketones, ur >80 (*)    Protein, ur 100 (*)    All other components within normal limits  KETONES, QUALITATIVE - Abnormal; Notable for the following:    Acetone, Bld SMALL (*)    All other components within normal limits  URINE MICROSCOPIC-ADD ON - Abnormal; Notable for the following:    Casts HYALINE CASTS (*)    All other components within normal limits  COMPREHENSIVE METABOLIC PANEL - Abnormal; Notable for the following:    Sodium 134 (*)    CO2 8 (*)    Glucose, Bld 291 (*)    Anion gap 30 (*)    All other components within normal limits  CBG MONITORING, ED - Abnormal; Notable for the following:    Glucose-Capillary 249 (*)    All other components within normal limits  I-STAT ARTERIAL BLOOD GAS, ED - Abnormal; Notable for the following:    pH, Arterial 7.141 (*)    pCO2 arterial 17.9 (*)    pO2, Arterial 122.0 (*)    Bicarbonate 6.1 (*)    Acid-base deficit 21.0 (*)    All other components within normal limits  CBC WITH DIFFERENTIAL  LIPASE, BLOOD  CBC WITH DIFFERENTIAL  I-STAT CG4 LACTIC ACID, ED    Imaging Review No results found.   EKG Interpretation None      MDM   Final diagnoses:  Type 2 diabetes mellitus with ketoacidosis without coma    Patient presents to the ER for evaluation of dehydration, nausea, vomiting began yesterday. Patient admits that he has been off all his medication except for his Glucophage for "some time". She is in the mid 300 range upon arrival to the ER, but blood work results in diagnosis of DKA. Patient has significant acidosis with pH of 7.14. He is not hemodynamically unstable. Electrolytes are unremarkable. Renal function is normal.  Patient does not have any abdominal pain or tenderness on examination. Vomiting is secondary to DKA, no concern for intra-abdominal process. Patient's blood sugar was elevated because of noncompliance with his medications.  Case discussed with critical care, Doctor Zubelivitsky. He did feel that the patient can be managed by the hospitalist in the step down unit on the stabilizer.    Orpah Greek, MD 08/10/13 [phone]

## 2013-08-10 NOTE — Progress Notes (Signed)
CRITICAL VALUE ALERT  Critical value received:  CO2 7  Date of notification:  08-10-13  Time of notification:  1920  Critical value read back: yes  Nurse who received alert:  Nickolas MadridLetitia Kallee Nam, RN  MD notified (1st page):  Donnamarie PoagK. Kirby, NP  Time of first page:  2015  MD notified (2nd page):  Time of second page:  Responding MD: Donnamarie PoagK. Kirby, NP  Time MD responded:  2030
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
## 2013-08-11 DIAGNOSIS — E872 Acidosis, unspecified: Secondary | ICD-10-CM

## 2013-08-11 LAB — BASIC METABOLIC PANEL
ANION GAP: 19 — AB (ref 5–15)
Anion gap: 15 (ref 5–15)
Anion gap: 16 — ABNORMAL HIGH (ref 5–15)
Anion gap: 18 — ABNORMAL HIGH (ref 5–15)
Anion gap: 19 — ABNORMAL HIGH (ref 5–15)
Anion gap: 19 — ABNORMAL HIGH (ref 5–15)
Anion gap: 21 — ABNORMAL HIGH (ref 5–15)
BUN: 10 mg/dL (ref 6–23)
BUN: 9 mg/dL (ref 6–23)
BUN: 8 mg/dL (ref 6–23)
BUN: 9 mg/dL (ref 6–23)
BUN: 9 mg/dL (ref 6–23)
BUN: 8 mg/dL (ref 6–23)
BUN: 9 mg/dL (ref 6–23)
CALCIUM: 8.1 mg/dL — AB (ref 8.4–10.5)
CALCIUM: 8.6 mg/dL (ref 8.4–10.5)
CALCIUM: 8.1 mg/dL — AB (ref 8.4–10.5)
CALCIUM: 7.9 mg/dL — AB (ref 8.4–10.5)
CO2: 13 mEq/L — ABNORMAL LOW (ref 19–32)
CO2: 13 mEq/L — ABNORMAL LOW (ref 19–32)
CO2: 14 meq/L — AB (ref 19–32)
CO2: 14 meq/L — AB (ref 19–32)
CO2: 19 mEq/L (ref 19–32)
CO2: 20 mEq/L (ref 19–32)
CO2: 21 meq/L (ref 19–32)
CREATININE: 0.57 mg/dL (ref 0.50–1.35)
CREATININE: 0.65 mg/dL (ref 0.50–1.35)
CREATININE: 0.61 mg/dL (ref 0.50–1.35)
CREATININE: 0.57 mg/dL (ref 0.50–1.35)
Calcium: 8 mg/dL — ABNORMAL LOW (ref 8.4–10.5)
Calcium: 8.1 mg/dL — ABNORMAL LOW (ref 8.4–10.5)
Calcium: 8.1 mg/dL — ABNORMAL LOW (ref 8.4–10.5)
Chloride: 100 mEq/L (ref 96–112)
Chloride: 100 mEq/L (ref 96–112)
Chloride: 103 mEq/L (ref 96–112)
Chloride: 104 mEq/L (ref 96–112)
Chloride: 104 mEq/L (ref 96–112)
Chloride: 104 mEq/L (ref 96–112)
Chloride: 99 mEq/L (ref 96–112)
Creatinine, Ser: 0.59 mg/dL (ref 0.50–1.35)
Creatinine, Ser: 0.59 mg/dL (ref 0.50–1.35)
Creatinine, Ser: 0.65 mg/dL (ref 0.50–1.35)
GFR calc Af Amer: >90 mL/min (ref >90)
GFR calc Af Amer: >90 mL/min (ref >90)
GFR calc Af Amer: >90 mL/min (ref >90)
GFR calc Af Amer: >90 mL/min (ref >90)
GFR calc Af Amer: >90 mL/min (ref >90)
GFR calc Af Amer: >90 mL/min (ref >90)
GFR calc Af Amer: >90 mL/min (ref >90)
GLUCOSE: 157 mg/dL — AB (ref 70–99)
GLUCOSE: 218 mg/dL — AB (ref 70–99)
GLUCOSE: 115 mg/dL — AB (ref 70–99)
GLUCOSE: 118 mg/dL — AB (ref 70–99)
GLUCOSE: 220 mg/dL — AB (ref 70–99)
Glucose, Bld: 153 mg/dL — ABNORMAL HIGH (ref 70–99)
Glucose, Bld: 118 mg/dL — ABNORMAL HIGH (ref 70–99)
POTASSIUM: 3.1 meq/L — AB (ref 3.7–5.3)
POTASSIUM: 3.2 meq/L — AB (ref 3.7–5.3)
POTASSIUM: 3.3 meq/L — AB (ref 3.7–5.3)
Potassium: 3.4 mEq/L — ABNORMAL LOW (ref 3.7–5.3)
Potassium: 4 mEq/L (ref 3.7–5.3)
Potassium: 3.1 mEq/L — ABNORMAL LOW (ref 3.7–5.3)
Potassium: 3.1 mEq/L — ABNORMAL LOW (ref 3.7–5.3)
SODIUM: 137 meq/L (ref 137–147)
SODIUM: 134 meq/L — AB (ref 137–147)
Sodium: 136 mEq/L — ABNORMAL LOW (ref 137–147)
Sodium: 137 mEq/L (ref 137–147)
Sodium: 139 mEq/L (ref 137–147)
Sodium: 137 mEq/L (ref 137–147)
Sodium: 135 mEq/L — ABNORMAL LOW (ref 137–147)

## 2013-08-11 LAB — GLUCOSE, CAPILLARY
GLUCOSE-CAPILLARY: 172 mg/dL — AB (ref 70–99)
GLUCOSE-CAPILLARY: 126 mg/dL — AB (ref 70–99)
GLUCOSE-CAPILLARY: 122 mg/dL — AB (ref 70–99)
GLUCOSE-CAPILLARY: 191 mg/dL — AB (ref 70–99)
GLUCOSE-CAPILLARY: 133 mg/dL — AB (ref 70–99)
GLUCOSE-CAPILLARY: 142 mg/dL — AB (ref 70–99)
GLUCOSE-CAPILLARY: 151 mg/dL — AB (ref 70–99)
Glucose-Capillary: 110 mg/dL — ABNORMAL HIGH (ref 70–99)
Glucose-Capillary: 111 mg/dL — ABNORMAL HIGH (ref 70–99)
Glucose-Capillary: 146 mg/dL — ABNORMAL HIGH (ref 70–99)
Glucose-Capillary: 149 mg/dL — ABNORMAL HIGH (ref 70–99)
Glucose-Capillary: 159 mg/dL — ABNORMAL HIGH (ref 70–99)
Glucose-Capillary: 160 mg/dL — ABNORMAL HIGH (ref 70–99)
Glucose-Capillary: 168 mg/dL — ABNORMAL HIGH (ref 70–99)
Glucose-Capillary: 181 mg/dL — ABNORMAL HIGH (ref 70–99)
Glucose-Capillary: 183 mg/dL — ABNORMAL HIGH (ref 70–99)
Glucose-Capillary: 205 mg/dL — ABNORMAL HIGH (ref 70–99)
Glucose-Capillary: 242 mg/dL — ABNORMAL HIGH (ref 70–99)

## 2013-08-11 LAB — HEMOGLOBIN A1C
Hgb A1c MFr Bld: 13.7 % — ABNORMAL HIGH (ref <5.7)
Mean Plasma Glucose: 346 mg/dL — ABNORMAL HIGH (ref <117)

## 2013-08-11 MED ORDER — LIVING WELL WITH DIABETES BOOK
Freq: Once | Status: AC
  Administered 2013-08-11: 15:00:00
  Filled 2013-08-11: qty 1

## 2013-08-11 NOTE — Progress Notes (Signed)
Inpatient Diabetes Program Recommendations  AACE/ADA: New Consensus Statement on Inpatient Glycemic Control (2013)  Target Ranges:  Prepandial:   less than 140 mg/dL      Peak postprandial:   less than 180 mg/dL (1-2 hours)      Critically ill patients:  140 - 180 mg/dL   When gap is closed and patient is ready to transition to subQ insulin recommend Lantus 30 units (home dose) 1-2 hours prior to gtt dc OR consider using 70/30 if patient decides that it would be more affordable out of pocket expense.  Reli[facility] Novolin 70/30 [facility] $25 a vial.  We had a long discussion about needing insulin and the fact that he was out of it for approximately 2 weeks.  Pt states that his insurance does help with cost of Lantus but it is still more expensive than the 70/30.  I gave patient a [employer] savings card to activate and asked him to contact his pharmacy to request how much his portion would be with insurance and the savings card.  The savings card says that it is $25 for a Lantus pen RX which is 5 pens and the card should be good for 1 yr.  I told him he could use either insulin but he needs consistency and if cost is ever a factor then maybe he should try the ReliOn 70/30.  If he decides to try 70/30 I would recommend starting 25 units BID with breakfast and supper.   Thank you  Piedad ClimesGina Rhiannan Kievit BSN, RN,CDE Inpatient Diabetes Coordinator [phone] (team pager)

## 2013-08-11 NOTE — Progress Notes (Signed)
08/11/2013 Pneumococcal Vaccine given in left deltoid at 10:14. Lot #[id number] and expire January 27, 2015. [employer] systems engineer[employer] RN.

## 2013-08-11 NOTE — Progress Notes (Signed)
[facility] PROGRESS NOTE  Clifford Whitney UJW:[id number]:[id number] DOB: 02/13/1985 DOA: 08/10/2013 PCP: Leo GrosserPICKARD,WARREN TOM, MD  Assessment/Plan: DKA -out of Diabetic meds, Insulin for 2 weeks -Continue Insulin gtt, D51/2NS  -Bmet q4 -DM coordinator consult -hbaic 14  Nausea/vomiting -due to 1, improved  DVT proph: lovenox  Code Status: Full Code Family Communication: none at bedside Disposition Plan: home when stable   HPI/Subjective: Feels better  Objective: Filed Vitals:   08/11/13 1100  BP: 110/68  Pulse: 74  Temp:   Resp: 16    Intake/Output Summary (Last 24 hours) at 08/11/13 1354 Last data filed at 08/11/13 1300  Gross per 24 hour  Intake      0 ml  Output   1700 ml  Net  -1700 ml   Filed Weights   08/10/13 1817 08/10/13 1928 08/11/13 0422  Weight: 65.772 kg (145 lb) 65.7 kg (144 lb 13.5 oz) 65.7 kg (144 lb 13.5 oz)    Exam:   General:  AAOx3  Cardiovascular: S1S2/RRR  Respiratory: CTAB  Abdomen: soft, Nt, BS present  Musculoskeletal: no edema c/c  Skin: old scar/plaque on LLE   Data Reviewed: Basic Metabolic Panel:  Recent Labs Lab 08/10/13 2157 08/11/13 08/11/13 0350 08/11/13 0818 08/11/13 1201  NA 132* 134* 137  136* 137 139  K 3.9 3.1* 3.2*  3.3* 3.4* 4.0  CL 100 100 104  104 104 103  CO2 10* 13* 14*  13* 14* 21  GLUCOSE 250* 220* 118*  115* 153* 118*  BUN 9 9 10  9 9 9   CREATININE 0.69 0.65 0.59  0.59 0.57 0.65  CALCIUM 8.2* 8.0* 8.1*  8.1* 8.1* 8.6   Liver Function Tests:  Recent Labs Lab 08/10/13 1305  AST 11  ALT 12  ALKPHOS 102  BILITOT 0.3  PROT 7.8  ALBUMIN 4.0    Recent Labs Lab 08/10/13 1305  LIPASE 37   No results found for this basename: AMMONIA,  in the last 168 hours CBC:  Recent Labs Lab 08/10/13 1305  WBC 9.3  NEUTROABS 7.1  HGB 16.9  HCT 48.6  MCV 86.0  PLT 354   Cardiac Enzymes: No results found for this basename: CKTOTAL, CKMB, CKMBINDEX, TROPONINI,  in the last 168  hours BNP (last 3 results) No results found for this basename: PROBNP,  in the last 8760 hours CBG:  Recent Labs Lab 08/11/13 0754 08/11/13 0909 08/11/13 1012 08/11/13 1135 08/11/13 1310  GLUCAP 168* 191* 183* 133* 142*    Recent Results (from the past 240 hour(s))  MRSA PCR SCREENING     Status: None   Collection Time    08/10/13  7:47 PM      Result Value Ref Range Status   MRSA by PCR NEGATIVE  NEGATIVE Final   Comment:            The GeneXpert MRSA Assay (FDA     approved for NASAL specimens     only), is one component of a     comprehensive MRSA colonization     surveillance program. It is not     intended to diagnose MRSA     infection nor to guide or     monitor treatment for     MRSA infections.     Studies: Dg Chest 2 View  08/10/2013   CLINICAL DATA:  Diabetes and cough.  Possible pneumonia.  EXAM: CHEST  2 VIEW  COMPARISON:  None.  FINDINGS: The heart size and mediastinal contours are  within normal limits. Both lungs are clear. The visualized skeletal structures are unremarkable.  IMPRESSION: No active cardiopulmonary disease.   Electronically Signed   By: Burman Nieves M.D.   On: 08/10/2013 22:30    Scheduled Meds: . enoxaparin (LOVENOX) injection  40 mg Subcutaneous Q24H  . living well with diabetes book   Does not apply Once   Continuous Infusions: . dextrose 5 % and 0.45% NaCl 125 mL/hr at 08/11/13 0546  . insulin (NOVOLIN-R) infusion 2.5 Units/hr (08/11/13 1311)   Antibiotics Given (last 72 hours)   None      Active Problems:   DKA (diabetic ketoacidoses)   Metabolic acidosis    Time spent:    [facility]  [facility] Pager [phone]. If 7PM-7AM, please contact night-coverage at [email or web address], password [facility] 08/11/2013, 1:54 PM  LOS: 1 day

## 2013-08-11 NOTE — Progress Notes (Signed)
Nutrition Brief Note  Patient identified on the Malnutrition Screening Tool (MST) Report for recent weight lost without trying and eating poorly because of a decreased appetite.  Patient reports per his MD, his weight loss is more than likely due to dehydration.  Wt Readings from Last 15 Encounters:  08/11/13 144 lb 13.5 oz (65.7 kg)  08/10/13 145 lb (65.772 kg)  11/16/12 166 lb (75.297 kg)  11/09/12 160 lb (72.576 kg)    Body mass index is 21.38 kg/(m^2). Patient meets criteria for Normal based on current BMI.   Patient currently NPO, however, he feels he will eat well once his diet is advanced.  Labs and medications reviewed.   No nutrition interventions warranted at this time. If nutrition issues arise, please consult RD.   Maureen ChattersKatie Khaleb Broz, RD, LDN Pager #: [phone] After-Hours Pager #: [phone]

## 2013-08-11 NOTE — Progress Notes (Signed)
08/11/2013 Patient was given Living Well Book for diabetes and information on channels to watch on the educational channel. Teaching was given to him by the Diabetes coordinator. [facility] RN.

## 2013-08-12 ENCOUNTER — Telehealth: Payer: Self-pay | Admitting: Family Medicine

## 2013-08-12 LAB — GLUCOSE, CAPILLARY
GLUCOSE-CAPILLARY: 128 mg/dL — AB (ref 70–99)
GLUCOSE-CAPILLARY: 154 mg/dL — AB (ref 70–99)
GLUCOSE-CAPILLARY: 163 mg/dL — AB (ref 70–99)
GLUCOSE-CAPILLARY: 165 mg/dL — AB (ref 70–99)
GLUCOSE-CAPILLARY: 181 mg/dL — AB (ref 70–99)
GLUCOSE-CAPILLARY: 200 mg/dL — AB (ref 70–99)
Glucose-Capillary: 150 mg/dL — ABNORMAL HIGH (ref 70–99)
Glucose-Capillary: 193 mg/dL — ABNORMAL HIGH (ref 70–99)
Glucose-Capillary: 190 mg/dL — ABNORMAL HIGH (ref 70–99)
Glucose-Capillary: 164 mg/dL — ABNORMAL HIGH (ref 70–99)
Glucose-Capillary: 178 mg/dL — ABNORMAL HIGH (ref 70–99)
Glucose-Capillary: 380 mg/dL — ABNORMAL HIGH (ref 70–99)
Glucose-Capillary: 236 mg/dL — ABNORMAL HIGH (ref 70–99)

## 2013-08-12 LAB — BASIC METABOLIC PANEL
ANION GAP: 12 (ref 5–15)
ANION GAP: 13 (ref 5–15)
Anion gap: 12 (ref 5–15)
BUN: 7 mg/dL (ref 6–23)
BUN: 6 mg/dL (ref 6–23)
BUN: 6 mg/dL (ref 6–23)
CALCIUM: 8.1 mg/dL — AB (ref 8.4–10.5)
CHLORIDE: 104 meq/L (ref 96–112)
CO2: 21 mEq/L (ref 19–32)
CO2: 24 mEq/L (ref 19–32)
CO2: 26 mEq/L (ref 19–32)
CREATININE: 0.67 mg/dL (ref 0.50–1.35)
Calcium: 7.8 mg/dL — ABNORMAL LOW (ref 8.4–10.5)
Calcium: 8.3 mg/dL — ABNORMAL LOW (ref 8.4–10.5)
Chloride: 101 mEq/L (ref 96–112)
Chloride: 100 mEq/L (ref 96–112)
Creatinine, Ser: 0.53 mg/dL (ref 0.50–1.35)
Creatinine, Ser: 0.65 mg/dL (ref 0.50–1.35)
GFR calc non Af Amer: >90 mL/min (ref >90)
Glucose, Bld: 144 mg/dL — ABNORMAL HIGH (ref 70–99)
Glucose, Bld: 186 mg/dL — ABNORMAL HIGH (ref 70–99)
Glucose, Bld: 351 mg/dL — ABNORMAL HIGH (ref 70–99)
POTASSIUM: 3.2 meq/L — AB (ref 3.7–5.3)
POTASSIUM: 2.5 meq/L — AB (ref 3.7–5.3)
Potassium: 3 mEq/L — ABNORMAL LOW (ref 3.7–5.3)
SODIUM: 138 meq/L (ref 137–147)
Sodium: 137 mEq/L (ref 137–147)
Sodium: 138 mEq/L (ref 137–147)

## 2013-08-12 MED ORDER — INSULIN ASPART 100 UNIT/ML ~~LOC~~ SOLN
0.0000 [IU] | Freq: Three times a day (TID) | SUBCUTANEOUS | Status: DC
  Administered 2013-08-12: 2 [IU] via SUBCUTANEOUS
  Administered 2013-08-12: 9 [IU] via SUBCUTANEOUS
  Administered 2013-08-13: 3 [IU] via SUBCUTANEOUS

## 2013-08-12 MED ORDER — INSULIN GLARGINE 100 UNIT/ML ~~LOC~~ SOLN
30.0000 [IU] | Freq: Every day | SUBCUTANEOUS | Status: DC
  Administered 2013-08-12 – 2013-08-13 (×2): 30 [IU] via SUBCUTANEOUS
  Filled 2013-08-12 (×2): qty 0.3

## 2013-08-12 MED ORDER — INSULIN ASPART 100 UNIT/ML ~~LOC~~ SOLN
3.0000 [IU] | Freq: Three times a day (TID) | SUBCUTANEOUS | Status: DC
  Administered 2013-08-12 – 2013-08-13 (×2): 3 [IU] via SUBCUTANEOUS

## 2013-08-12 MED ORDER — SODIUM CHLORIDE 0.9 % IV SOLN
INTRAVENOUS | Status: DC
  Administered 2013-08-12: 1000 mL via INTRAVENOUS

## 2013-08-12 MED ORDER — POTASSIUM CHLORIDE 10 MEQ/100ML IV SOLN
10.0000 meq | INTRAVENOUS | Status: AC
  Administered 2013-08-12 (×4): 10 meq via INTRAVENOUS
  Filled 2013-08-12 (×4): qty 100

## 2013-08-12 NOTE — Plan of Care (Signed)
Problem: Phase I Progression Outcomes Goal: CBGs steadily decreasing on IV insulin drip Outcome: Completed/Met Date Met:  08/12/13 Insulin drip off. Glucose stabilizer off. Anion gap closed.

## 2013-08-12 NOTE — Telephone Encounter (Signed)
Patient is    Pharm[facility] [address]

## 2013-08-12 NOTE — Progress Notes (Signed)
Inpatient Diabetes Program Recommendations  AACE/ADA: New Consensus Statement on Inpatient Glycemic Control (2013)  Target Ranges:  Prepandial:   less than 140 mg/dL      Peak postprandial:   less than 180 mg/dL (1-2 hours)      Critically ill patients:  140 - 180 mg/dL   Inpatient Diabetes Program Recommendations Correction (SSI): add Novolog sensitive scale TID Insulin - Meal Coverage: add Novolog 3 units TID with meals  Thank you  Clifford Whitney BSN, RN,CDE Inpatient Diabetes Coordinator [phone] (team pager)

## 2013-08-12 NOTE — Progress Notes (Signed)
[facility] PROGRESS NOTE  Clifford Whitney UJW:[id number] DOB: 09/18/1985 DOA: 08/10/2013 PCP: Lynnea FerrierPICKARD,WARREN TOM, MD  Assessment/Plan: DKA -out of Diabetic meds, Insulin for 2 weeks -Transition to Lantus, NS at 75cc/hr -add SSI -DM coordinator consult appreciated -hbaic 14  Nausea/vomiting -due to 1, improved  DVT proph: lovenox  Code Status: Full Code Family Communication: none at bedside Disposition Plan: home when stable   HPI/Subjective: Feels better  Objective: Filed Vitals:   08/12/13 1607  BP: 125/70  Pulse: 85  Temp: 97.9 F (36.6 C)  Resp: 16    Intake/Output Summary (Last 24 hours) at 08/12/13 1624 Last data filed at 08/12/13 1435  Gross per 24 hour  Intake   2515 ml  Output   2550 ml  Net    -35 ml   Filed Weights   08/10/13 1817 08/10/13 1928 08/11/13 0422  Weight: 65.772 kg (145 lb) 65.7 kg (144 lb 13.5 oz) 65.7 kg (144 lb 13.5 oz)    Exam:   General:  AAOx3  Cardiovascular: S1S2/RRR  Respiratory: CTAB  Abdomen: soft, Nt, BS present  Musculoskeletal: no edema c/c  Skin: old scar/plaque on LLE   Data Reviewed: Basic Metabolic Panel:  Recent Labs Lab 08/11/13 1201 08/11/13 1500 08/11/13 2003 08/12/13 0022 08/12/13 0405  NA 139 137 135* 137 138  K 4.0 3.1* 3.1* 2.5* 3.0*  CL 103 100 99 104 101  CO2 21 19 20 21 24   GLUCOSE 118* 157* 218* 144* 186*  BUN 9 8 8 7 6   CREATININE 0.65 0.61 0.57 0.53 0.67  CALCIUM 8.6 8.1* 7.9* 7.8* 8.1*   Liver Function Tests:  Recent Labs Lab 08/10/13 1305  AST 11  ALT 12  ALKPHOS 102  BILITOT 0.3  PROT 7.8  ALBUMIN 4.0    Recent Labs Lab 08/10/13 1305  LIPASE 37   No results found for this basename: AMMONIA,  in the last 168 hours CBC:  Recent Labs Lab 08/10/13 1305  WBC 9.3  NEUTROABS 7.1  HGB 16.9  HCT 48.6  MCV 86.0  PLT 354   Cardiac Enzymes: No results found for this basename: CKTOTAL, CKMB, CKMBINDEX, TROPONINI,  in the last 168 hours BNP (last 3  results) No results found for this basename: PROBNP,  in the last 8760 hours CBG:  Recent Labs Lab 08/12/13 0522 08/12/13 0625 08/12/13 0727 08/12/13 0901 08/12/13 1205  GLUCAP 190* 164* 154* 200* 178*    Recent Results (from the past 240 hour(s))  MRSA PCR SCREENING     Status: None   Collection Time    08/10/13  7:47 PM      Result Value Ref Range Status   MRSA by PCR NEGATIVE  NEGATIVE Final   Comment:            The GeneXpert MRSA Assay (FDA     approved for NASAL specimens     only), is one component of a     comprehensive MRSA colonization     surveillance program. It is not     intended to diagnose MRSA     infection nor to guide or     monitor treatment for     MRSA infections.     Studies: Dg Chest 2 View  08/10/2013   CLINICAL DATA:  Diabetes and cough.  Possible pneumonia.  EXAM: CHEST  2 VIEW  COMPARISON:  None.  FINDINGS: The heart size and mediastinal contours are within normal limits. Both lungs are clear. The visualized skeletal structures  are unremarkable.  IMPRESSION: No active cardiopulmonary disease.   Electronically Signed   By: Burman Nieves M.D.   On: 08/10/2013 22:30    Scheduled Meds: . enoxaparin (LOVENOX) injection  40 mg Subcutaneous Q24H  . insulin aspart  0-9 Units Subcutaneous TID WC  . insulin aspart  3 Units Subcutaneous TID WC  . insulin glargine  30 Units Subcutaneous Daily   Continuous Infusions: . sodium chloride 1,000 mL (08/12/13 1216)   Antibiotics Given (last 72 hours)   None      Active Problems:   DKA (diabetic ketoacidoses)   Metabolic acidosis    Time spent:    [facility]  [facility] Pager [phone]. If 7PM-7AM, please contact night-coverage at [email or web address], password [facility] 08/12/2013, 4:24 PM  LOS: 2 days

## 2013-08-13 ENCOUNTER — Telehealth: Payer: Self-pay | Admitting: *Deleted

## 2013-08-13 DIAGNOSIS — E1310 Other specified diabetes mellitus with ketoacidosis without coma: Secondary | ICD-10-CM

## 2013-08-13 LAB — BASIC METABOLIC PANEL
ANION GAP: 9 (ref 5–15)
BUN: 7 mg/dL (ref 6–23)
CO2: 29 mEq/L (ref 19–32)
CREATININE: 0.54 mg/dL (ref 0.50–1.35)
Calcium: 8.3 mg/dL — ABNORMAL LOW (ref 8.4–10.5)
Chloride: 102 mEq/L (ref 96–112)
GFR calc non Af Amer: >90 mL/min (ref >90)
Glucose, Bld: 233 mg/dL — ABNORMAL HIGH (ref 70–99)
Potassium: 3.1 mEq/L — ABNORMAL LOW (ref 3.7–5.3)
Sodium: 140 mEq/L (ref 137–147)

## 2013-08-13 LAB — GLUCOSE, CAPILLARY: Glucose-Capillary: 250 mg/dL — ABNORMAL HIGH (ref 70–99)

## 2013-08-13 MED ORDER — INSULIN GLARGINE 100 UNIT/ML ~~LOC~~ SOLN: 40.0000 [IU] | Freq: Every morning | SUBCUTANEOUS | Status: DC

## 2013-08-13 NOTE — Telephone Encounter (Signed)
Pt has been d/c from hospital today and mother is calling to look at discharge papers so we can determine what meds needs to be called in. States everything but insulin it has been taking care of.  [facility] [address]

## 2013-08-13 NOTE — Care Management Note (Signed)
    Page 1 of 1   08/13/2013     11:14:07 AM CARE MANAGEMENT NOTE 08/13/2013  Patient:  Clifford Whitney,Clifford Whitney   Account Number:  [id number]  Date Initiated:  08/13/2013  Documentation initiated by:  Joani Cosma  Subjective/Objective Assessment:   dx DKA    PCP  Lynnea FerrierWarren Pickard     DC Planning Services  CM consult      Status of service:  Completed, signed off  Discharge Disposition:  HOME/SELF CARE  Per UR Regulation:  Reviewed for med. necessity/level of care/duration of stay  Comments:  08/13/13 1037 Jemar Paulsen RN MSN BSN CCM Pt reports he has his own home but often visits his mom, has most meals with her - she is also a diabetic and adheres to diabetic diet. Dropped and broke one bottle of insulin and was unable to get an appt with his PCP to get another script.  Also sometimes has difficulty with copay for Lantus.  Pt was given Lantus savings card by diabetes coordinator. CM talked with [employer] CM, Trula OreChristina - she wants to work with pt to ensure he is able to afford Lantus.  Provided pt with her contact # [phone]) she will also contact his PCP to work with him on pt's ability to fill scripts and will call and talk with pt on 8/3.

## 2013-08-13 NOTE — Discharge Instructions (Signed)
Amino Acid Supplementation Amino acids are nutrients that combine together to create proteins. Proteins are one of the nutrients the body uses for energy. There are two categories of amino acids: essential and nonessential. The body requires you to ingest essential amino acids, through foods that have "complete" proteins. However, the body can produce its own nonessential amino acids. Many athletes use amino acid supplementation in an effort to increase athletic performance.  WHY ATHLETES USE IT Athletes use amino acids, such as arginine and lysine, with the belief that they will increase the release of growth hormone in the body. Growth hormone plays a role in forming muscle. Endurance athletes often use other amino acids, such as leucine, in an effort to reduce fatigue during training. ADVERSE EFFECTS OF AMINO ACID SUPPLEMENTATION  Loose stool (diarrhea).  Stomach pain.  Headache.  Cough.  Constipation.  Fainting.  Fatigue.  Rash.  Seizures.  Hepatitis.  Joint pain.  Rash, joint pain, fatigue, and death (eosinophilia-myalgia syndrome).  Rapid heart rate (tachycardia).  Inflammation of the muscle (myositis). PHARMACOLOGY Protein is an essential part of the diet. However, the amount needed in an athlete's diet is debated. Currently, the advised intake of protein for an athlete is 1.5 grams for each kilogram of body weight. There is little evidence to suggest that increased protein intake will improve athletic performance. There are currently some animal studies showing the benefits of amino acid supplementation. However, these results have not been reproduced in human studies. PREVENTION The best way to prevent adverse effects of amino acid supplementation is to consume the advised amount of protein only through your diet. Supplements do not go through rigorous testing. They may contain impurities that could cause further side effects. It is not known how amino acid  supplementation effects athletic performance. Document Released: 12/31/2004 Document Revised: 03/25/2011 Document Reviewed: 04/14/2008 [facility] Patient Information 2015 [place], [place]. This information is not intended to replace advice given to you by your health care provider. Make sure you discuss any questions you have with your health care provider.

## 2013-08-13 NOTE — Discharge Summary (Signed)
Physician Discharge Summary  Clifford Whitney QPR:[id number] DOB: 19-Oct-1985 DOA: 08/10/2013  PCP: Odette Fraction, MD  Admit date: 08/10/2013 Discharge date: 08/13/2013  Time spent: 35 minutes  Recommendations for Outpatient Follow-up:  PCP in 1-2weeks  Discharge Diagnoses:  Active Problems:   DKA (diabetic ketoacidoses)   Metabolic acidosis   Dehydration  Discharge Condition: stable  Diet recommendation: diabetic  Filed Weights   08/10/13 1817 08/10/13 1928 08/11/13 0422  Weight: 65.772 kg (145 lb) 65.7 kg (144 lb 13.5 oz) 65.7 kg (144 lb 13.5 oz)    History of present illness:  HPI: Clifford Whitney is a 28 y.o. male with h/o DM on lantus , glipizide and metformin comes in for nausea, vomiting, and generalized malaise. On arrival to ED he was found to be in DKA. His bicarb is 8, and pH is 7.1. Lactic acid 1.4. He reported being out f  medications for 2 weeks. His hgba1c is >14.    Hospital Course:  DKA  -out of Diabetic meds, Insulin for 2 weeks  -corrected after Insulin gtt and IVF for 2days -Transitioned back to lantus -Will be discharged home on 40units daily -given lantus savings card -down the road may need meal coverage insulin too -hbaic 14   Nausea/vomiting  -due to 1, improved   Discharge Exam: Filed Vitals:   08/13/13 0726  BP: 114/68  Pulse: 75  Temp: 97.6 F (36.4 C)  Resp: 12    General: AAOx3 Cardiovascular: S1S2/RRR Respiratory: CTAB  Discharge Instructions You were cared for by a hospitalist during your hospital stay. If you have any questions about your discharge medications or the care you received while you were in the hospital after you are discharged, you can call the unit and asked to speak with the hospitalist on call if the hospitalist that took care of you is not available. Once you are discharged, your primary care physician will handle any further medical issues. Please note that NO REFILLS for any discharge medications will be  authorized once you are discharged, as it is imperative that you return to your primary care physician (or establish a relationship with a primary care physician if you do not have one) for your aftercare needs so that they can reassess your need for medications and monitor your lab values.  Discharge Instructions   Diet - low sodium heart healthy    Complete by:  As directed      Diet Carb Modified    Complete by:  As directed      Increase activity slowly    Complete by:  As directed             Medication List    STOP taking these medications       amoxicillin-clavulanate 875-125 MG per tablet  Commonly known as:  AUGMENTIN      TAKE these medications       B-D INS SYR ULTRAFINE .3CC/30G 30G X 1/2" 0.3 ML Misc  Generic drug:  Insulin Syringe-Needle U-100  USE ONCE A DAY.     insulin glargine 100 UNIT/ML injection  Commonly known as:  LANTUS  Inject 0.4 mLs (40 Units total) into the skin every morning.     lisinopril 10 MG tablet  Commonly known as:  PRINIVIL,ZESTRIL  Take 1 tablet (10 mg total) by mouth daily.     metFORMIN 1000 MG tablet  Commonly known as:  GLUCOPHAGE  Take 1 tablet (1,000 mg total) by mouth 2 (two) times daily.  ONE TOUCH ULTRA 2 W/DEVICE Kit  Pt will need strips for checking BS tid (Disp. 100/5 refills) and lancets. (1 box/5refills) Dx - 250.00 -     pioglitazone 45 MG tablet  Commonly known as:  ACTOS  Take 1 tablet (45 mg total) by mouth daily.     pravastatin 40 MG tablet  Commonly known as:  PRAVACHOL  Take 1 tablet (40 mg total) by mouth daily.       Allergies  Allergen Reactions  . Aspirin Swelling    Reaction: Facial swelling  . Ibuprofen Swelling    Reaction: Facial swelling       Follow-up Information   Follow up with [facility], MD. Schedule an appointment as soon as possible for a visit in 1 week.   Specialty:  Family Medicine   Contact information:   [address] [place]  [address] [phone]        The results of significant diagnostics from this hospitalization (including imaging, microbiology, ancillary and laboratory) are listed below for reference.    Significant Diagnostic Studies: Dg Chest 2 View  08/10/2013   CLINICAL DATA:  Diabetes and cough.  Possible pneumonia.  EXAM: CHEST  2 VIEW  COMPARISON:  None.  FINDINGS: The heart size and mediastinal contours are within normal limits. Both lungs are clear. The visualized skeletal structures are unremarkable.  IMPRESSION: No active cardiopulmonary disease.   Electronically Signed   By: Lucienne Capers M.D.   On: 08/10/2013 22:30    Microbiology: Recent Results (from the past 240 hour(s))  MRSA PCR SCREENING     Status: None   Collection Time    08/10/13  7:47 PM      Result Value Ref Range Status   MRSA by PCR NEGATIVE  NEGATIVE Final   Comment:            The GeneXpert MRSA Assay (FDA     approved for NASAL specimens     only), is one component of a     comprehensive MRSA colonization     surveillance program. It is not     intended to diagnose MRSA     infection nor to guide or     monitor treatment for     MRSA infections.     Labs: Basic Metabolic Panel:  Recent Labs Lab 08/11/13 2003 08/12/13 0022 08/12/13 0405 08/12/13 1556 08/13/13 0008  NA 135* 137 138 138 140  K 3.1* 2.5* 3.0* 3.2* 3.1*  CL 99 104 101 100 102  CO2 20 21 24 26 29   GLUCOSE 218* 144* 186* 351* 233*  BUN 8 7 6 6 7   CREATININE 0.57 0.53 0.67 0.65 0.54  CALCIUM 7.9* 7.8* 8.1* 8.3* 8.3*   Liver Function Tests:  Recent Labs Lab 08/10/13 1305  AST 11  ALT 12  ALKPHOS 102  BILITOT 0.3  PROT 7.8  ALBUMIN 4.0    Recent Labs Lab 08/10/13 1305  LIPASE 37   No results found for this basename: AMMONIA,  in the last 168 hours CBC:  Recent Labs Lab 08/10/13 1305  WBC 9.3  NEUTROABS 7.1  HGB 16.9  HCT 48.6  MCV 86.0  PLT 354   Cardiac Enzymes: No results found for this basename: CKTOTAL,  CKMB, CKMBINDEX, TROPONINI,  in the last 168 hours BNP: BNP (last 3 results) No results found for this basename: PROBNP,  in the last 8760 hours CBG:  Recent Labs Lab 08/12/13 0901 08/12/13 1205 08/12/13  1608 08/12/13 2128 08/13/13 0723  GLUCAP 200* 178* 380* 236* 250*       Signed:  Joven Mom  [facility] 08/13/2013, 10:08 AM

## 2013-08-13 NOTE — Telephone Encounter (Signed)
Spoke to pt and he states that the hospital did give him a RX for insulin so as of now he does not need anything. Will call back if he needs any medication called in.

## 2013-08-13 NOTE — Progress Notes (Signed)
Pt is being discharged. Removed IV to rt arm, tip intact and pt tolerated well. Pt in no distress. Nurse reviewed pt's d/c papers with pt and his prescription and he had no questions. Pt's mother was here to take pt to home. Central telemetry notified. Nurse assistant is taking pt to d/c doors.

## 2013-08-20 ENCOUNTER — Other Ambulatory Visit: Payer: Self-pay | Admitting: Family Medicine

## 2013-08-20 ENCOUNTER — Ambulatory Visit (INDEPENDENT_AMBULATORY_CARE_PROVIDER_SITE_OTHER): Payer: Managed Care, Other (non HMO) | Admitting: Family Medicine

## 2013-08-20 ENCOUNTER — Encounter: Payer: Self-pay | Admitting: Family Medicine

## 2013-08-20 VITALS — BP 100/72 | HR 92 | Temp 97.8°F | Resp 18 | Ht 67.0 in | Wt 158.0 lb

## 2013-08-20 DIAGNOSIS — Z794 Long term (current) use of insulin: Secondary | ICD-10-CM

## 2013-08-20 DIAGNOSIS — E860 Dehydration: Secondary | ICD-10-CM

## 2013-08-20 DIAGNOSIS — Z09 Encounter for follow-up examination after completed treatment for conditions other than malignant neoplasm: Secondary | ICD-10-CM

## 2013-08-20 DIAGNOSIS — E1165 Type 2 diabetes mellitus with hyperglycemia: Secondary | ICD-10-CM

## 2013-08-20 DIAGNOSIS — IMO0002 Reserved for concepts with insufficient information to code with codable children: Secondary | ICD-10-CM

## 2013-08-20 DIAGNOSIS — IMO0001 Reserved for inherently not codable concepts without codable children: Secondary | ICD-10-CM

## 2013-08-20 LAB — BASIC METABOLIC PANEL WITH GFR
BUN: 8 mg/dL (ref 6–23)
CHLORIDE: 94 meq/L — AB (ref 96–112)
CO2: 32 mEq/L (ref 19–32)
Calcium: 9.7 mg/dL (ref 8.4–10.5)
Creat: 0.78 mg/dL (ref 0.50–1.35)
GFR, Est African American: >89 mL/min
Glucose, Bld: 210 mg/dL — ABNORMAL HIGH (ref 70–99)
Potassium: 4.9 mEq/L (ref 3.5–5.3)
SODIUM: 133 meq/L — AB (ref 135–145)

## 2013-08-20 MED ORDER — INSULIN ASPART 100 UNIT/ML FLEXPEN: 5.0000 [IU] | PEN_INJECTOR | Freq: Three times a day (TID) | SUBCUTANEOUS | Status: DC

## 2013-08-20 NOTE — Progress Notes (Signed)
Subjective:    Patient ID: Clifford Whitney, male    DOB: 19-Feb-1985, 28 y.o.   MRN: [id number]  HPI Patient was recently admitted in DKA.  I have copied his DC summary below for my reference: Admit date: 08/10/2013  Discharge date: 08/13/2013  Time spent: 35 minutes  Recommendations for Outpatient Follow-up:  PCP in 1-2weeks  Discharge Diagnoses:  Active Problems:  DKA (diabetic ketoacidoses)  Metabolic acidosis  Dehydration  Discharge Condition: stable  Diet recommendation: diabetic  Filed Weights    08/10/13 1817  08/10/13 1928  08/11/13 0422   Weight:  65.772 kg (145 lb)  65.7 kg (144 lb 13.5 oz)  65.7 kg (144 lb 13.5 oz)   History of present illness:  HPI: Clifford Whitney is a 28 y.o. male with h/o DM on lantus , glipizide and metformin comes in for nausea, vomiting, and generalized malaise. On arrival to ED he was found to be in DKA. His bicarb is 8, and pH is 7.1. Lactic acid 1.4. He reported being out f medications for 2 weeks. His hgba1c is >14.  Hospital Course:  DKA  -out of Diabetic meds, Insulin for 2 weeks  -corrected after Insulin gtt and IVF for 2days  -Transitioned back to lantus  -Will be discharged home on 40units daily  -given lantus savings card  -down the road may need meal coverage insulin too  -hbaic 14  Nausea/vomiting  -due to 1, improved          TAKE these medications       B-D INS SYR ULTRAFINE .3CC/30G 30G X 1/2" 0.3 ML Misc    Generic drug: Insulin Syringe-Needle U-100    USE ONCE A DAY.    insulin glargine 100 UNIT/ML injection    Commonly known as: LANTUS    Inject 0.4 mLs (40 Units total) into the skin every morning.    lisinopril 10 MG tablet    Commonly known as: PRINIVIL,ZESTRIL    Take 1 tablet (10 mg total) by mouth daily.    metFORMIN 1000 MG tablet    Commonly known as: GLUCOPHAGE    Take 1 tablet (1,000 mg total) by mouth 2 (two) times daily.    ONE TOUCH ULTRA 2 W/DEVICE Kit    Pt will need strips for checking BS tid (Disp.  100/5 refills) and lancets. (1 box/5refills) Dx - 250.00 -    pioglitazone 45 MG tablet    Commonly known as: ACTOS    Take 1 tablet (45 mg total) by mouth daily.    pravastatin 40 MG tablet    Commonly known as: PRAVACHOL    Take 1 tablet (40 mg total) by mouth daily.   He is here today for follow up.  Over the last several days, the patient's fasting blood sugar has fallen to 80-139. His two-hour postprandial sugars however are 172-214.  Patient is having no hypoglycemic episodes. He denies any nausea or vomiting. His weight is up approximately 10 pounds. He is tolerating PO.  He denies any abdominal pain or muscle cramps or fasiculations. Past Medical History  Diagnosis Date  . Family history of anesthesia complication     "mom woke up on operating table"  . Type 1 diabetes mellitus     "actually type 1.5"  . Migraine     "a few times/yr" (08/10/2013)  . ADD (attention deficit disorder)     "bad!!!!!" (08/10/2013)   Past Surgical History  Procedure Laterality Date  . No past  surgeries     Current Outpatient Prescriptions on File Prior to Visit  Medication Sig Dispense Refill  . B-D INS SYR ULTRAFINE .3CC/30G 30G X 1/2" 0.3 ML MISC USE ONCE A DAY.  100 each  5  . Blood Glucose Monitoring Suppl (ONE TOUCH ULTRA 2) W/DEVICE KIT Pt will need strips for checking BS tid (Disp. 100/5 refills) and lancets. (1 box/5refills) Dx - 250.00 -  1 each  0  . insulin glargine (LANTUS) 100 UNIT/ML injection Inject 0.4 mLs (40 Units total) into the skin every morning.  10 mL  1  . lisinopril (PRINIVIL,ZESTRIL) 10 MG tablet Take 1 tablet (10 mg total) by mouth daily.  30 tablet  0  . metFORMIN (GLUCOPHAGE) 1000 MG tablet Take 1 tablet (1,000 mg total) by mouth 2 (two) times daily.  180 tablet  1  . pioglitazone (ACTOS) 45 MG tablet Take 1 tablet (45 mg total) by mouth daily.  30 tablet  0  . pravastatin (PRAVACHOL) 40 MG tablet Take 1 tablet (40 mg total) by mouth daily.  30 tablet  0   No current  facility-administered medications on file prior to visit.   Allergies  Allergen Reactions  . Aspirin Swelling    Reaction: Facial swelling  . Ibuprofen Swelling    Reaction: Facial swelling   History   Social History  . Marital Status: Legally Separated    Spouse Name: N/A    Number of Children: N/A  . Years of Education: N/A   Occupational History  . Not on file.   Social History Main Topics  . Smoking status: Never Smoker   . Smokeless tobacco: Never Used  . Alcohol Use: 1.2 oz/week    2 Cans of beer per week  . Drug Use: No  . Sexual Activity: Yes   Other Topics Concern  . Not on file   Social History Narrative  . No narrative on file      Review of Systems  All other systems reviewed and are negative.      Objective:   Physical Exam  Vitals reviewed. Constitutional: He appears well-developed and well-nourished.  Neck: Neck supple. No JVD present. No thyromegaly present.  Cardiovascular: Normal rate, regular rhythm and normal heart sounds.  Exam reveals no gallop and no friction rub.   No murmur heard. Pulmonary/Chest: Effort normal and breath sounds normal. No respiratory distress. He has no wheezes. He has no rales.  Abdominal: Soft. Bowel sounds are normal. He exhibits no distension. There is no tenderness. There is no rebound and no guarding.  Musculoskeletal: He exhibits no edema.  Lymphadenopathy:    He has no cervical adenopathy.          Assessment & Plan:  Hospital discharge follow-up - Plan: BASIC METABOLIC PANEL WITH GFR, insulin aspart (NOVOLOG FLEXPEN) 100 UNIT/ML FlexPen  Insulin dependent type 2 diabetes mellitus, uncontrolled  Dehydration   Fasting blood sugars seem well controlled. Therefore I will not increase the patient's Lantus. However to postprandial sugars are not yet at goal. There start the patient on NovoLog 5 units 3 times a day with meals. I would like to recheck his two-hour postprandial sugars in one week. I will  check a BMP today to reassess his electrolytes. Followup in one week.  Dehydration has resolved. - BASIC METABOLIC PANEL WITH GFR - insulin aspart (NOVOLOG FLEXPEN) 100 UNIT/ML FlexPen; Inject 5 Units into the skin 3 (three) times daily with meals.  Dispense: 15 mL; Refill: 11

## 2013-08-27 ENCOUNTER — Ambulatory Visit (INDEPENDENT_AMBULATORY_CARE_PROVIDER_SITE_OTHER): Payer: Managed Care, Other (non HMO) | Admitting: Family Medicine

## 2013-08-27 ENCOUNTER — Encounter: Payer: Self-pay | Admitting: Family Medicine

## 2013-08-27 VITALS — BP 128/70 | HR 74 | Temp 98.1°F | Resp 16 | Ht 67.0 in | Wt 163.0 lb

## 2013-08-27 DIAGNOSIS — IMO0001 Reserved for inherently not codable concepts without codable children: Secondary | ICD-10-CM

## 2013-08-27 DIAGNOSIS — Z794 Long term (current) use of insulin: Principal | ICD-10-CM

## 2013-08-27 DIAGNOSIS — IMO0002 Reserved for concepts with insufficient information to code with codable children: Secondary | ICD-10-CM

## 2013-08-27 DIAGNOSIS — E1165 Type 2 diabetes mellitus with hyperglycemia: Secondary | ICD-10-CM

## 2013-08-27 NOTE — Progress Notes (Signed)
Subjective:    Patient ID: Clifford Whitney, male    DOB: 22-Jan-1985, 27 y.o.   MRN: [id number]  HPI  Patient was recently admitted in DKA.  I have copied his DC summary below for my reference: Admit date: 08/10/2013  Discharge date: 08/13/2013  Time spent: 35 minutes  Recommendations for Outpatient Follow-up:  PCP in 1-2weeks  Discharge Diagnoses:  Active Problems:  DKA (diabetic ketoacidoses)  Metabolic acidosis  Dehydration  Discharge Condition: stable  Diet recommendation: diabetic  Filed Weights    08/10/13 1817  08/10/13 1928  08/11/13 0422   Weight:  65.772 kg (145 lb)  65.7 kg (144 lb 13.5 oz)  65.7 kg (144 lb 13.5 oz)   History of present illness:  HPI: Clifford Whitney is a 28 y.o. male with h/o DM on lantus , glipizide and metformin comes in for nausea, vomiting, and generalized malaise. On arrival to ED he was found to be in DKA. His bicarb is 8, and pH is 7.1. Lactic acid 1.4. He reported being out f medications for 2 weeks. His hgba1c is >14.  Hospital Course:  DKA  -out of Diabetic meds, Insulin for 2 weeks  -corrected after Insulin gtt and IVF for 2days  -Transitioned back to lantus  -Will be discharged home on 40units daily  -given lantus savings card  -down the road may need meal coverage insulin too  -hbaic 14  Nausea/vomiting  -due to 1, improved          TAKE these medications       B-D INS SYR ULTRAFINE .3CC/30G 30G X 1/2" 0.3 ML Misc    Generic drug: Insulin Syringe-Needle U-100    USE ONCE A DAY.    insulin glargine 100 UNIT/ML injection    Commonly known as: LANTUS    Inject 0.4 mLs (40 Units total) into the skin every morning.    lisinopril 10 MG tablet    Commonly known as: PRINIVIL,ZESTRIL    Take 1 tablet (10 mg total) by mouth daily.    metFORMIN 1000 MG tablet    Commonly known as: GLUCOPHAGE    Take 1 tablet (1,000 mg total) by mouth 2 (two) times daily.    ONE TOUCH ULTRA 2 W/DEVICE Kit    Pt will need strips for checking BS tid (Disp.  100/5 refills) and lancets. (1 box/5refills) Dx - 250.00 -    pioglitazone 45 MG tablet    Commonly known as: ACTOS    Take 1 tablet (45 mg total) by mouth daily.    pravastatin 40 MG tablet    Commonly known as: PRAVACHOL    Take 1 tablet (40 mg total) by mouth daily.   08/20/13 He is here today for follow up.  Over the last several days, the patient's fasting blood sugar has fallen to 80-139. His two-hour postprandial sugars however are 172-214.  Patient is having no hypoglycemic episodes. He denies any nausea or vomiting. His weight is up approximately 10 pounds. He is tolerating PO.  He denies any abdominal pain or muscle cramps or fasiculations.  At that time, my plan was:  Fasting blood sugars seem well controlled. Therefore I will not increase the patient's Lantus. However to postprandial sugars are not yet at goal. There start the patient on NovoLog 5 units 3 times a day with meals. I would like to recheck his two-hour postprandial sugars in one week. I will check a BMP today to reassess his electrolytes. Followup in one  week.  Dehydration has resolved. - BASIC METABOLIC PANEL WITH GFR - insulin aspart (NOVOLOG FLEXPEN) 100 UNIT/ML FlexPen; Inject 5 Units into the skin 3 (three) times daily with meals.  Dispense: 15 mL; Refill: 11  08/27/13 He is here today for follow up.  Fasting blood sugars have been averaging 130 to 150. However 2 hour postprandial sugars are typically 180-220. However he also is occasionally having hypoglycemic episodes depending upon what he eats. Past Medical History  Diagnosis Date  . Family history of anesthesia complication     "mom woke up on operating table"  . Type 1 diabetes mellitus     "actually type 1.5"  . Migraine     "a few times/yr" (08/10/2013)  . ADD (attention deficit disorder)     "bad!!!!!" (08/10/2013)   Past Surgical History  Procedure Laterality Date  . No past surgeries     Current Outpatient Prescriptions on File Prior to Visit    Medication Sig Dispense Refill  . B-D INS SYR ULTRAFINE .3CC/30G 30G X 1/2" 0.3 ML MISC USE ONCE A DAY.  100 each  5  . B-D UF III MINI PEN NEEDLES 31G X 5 MM MISC TEST 3 TIMES A DAY  100 each  1  . Blood Glucose Monitoring Suppl (ONE TOUCH ULTRA 2) W/DEVICE KIT Pt will need strips for checking BS tid (Disp. 100/5 refills) and lancets. (1 box/5refills) Dx - 250.00 -  1 each  0  . insulin aspart (NOVOLOG FLEXPEN) 100 UNIT/ML FlexPen Inject 5 Units into the skin 3 (three) times daily with meals.  15 mL  11  . insulin glargine (LANTUS) 100 UNIT/ML injection Inject 0.4 mLs (40 Units total) into the skin every morning.  10 mL  1  . lisinopril (PRINIVIL,ZESTRIL) 10 MG tablet Take 1 tablet (10 mg total) by mouth daily.  30 tablet  0  . metFORMIN (GLUCOPHAGE) 1000 MG tablet Take 1 tablet (1,000 mg total) by mouth 2 (two) times daily.  180 tablet  1  . pioglitazone (ACTOS) 45 MG tablet Take 1 tablet (45 mg total) by mouth daily.  30 tablet  0  . pravastatin (PRAVACHOL) 40 MG tablet Take 1 tablet (40 mg total) by mouth daily.  30 tablet  0   No current facility-administered medications on file prior to visit.   Allergies  Allergen Reactions  . Aspirin Swelling    Reaction: Facial swelling  . Ibuprofen Swelling    Reaction: Facial swelling   History   Social History  . Marital Status: Legally Separated    Spouse Name: N/A    Number of Children: N/A  . Years of Education: N/A   Occupational History  . Not on file.   Social History Main Topics  . Smoking status: Never Smoker   . Smokeless tobacco: Never Used  . Alcohol Use: 1.2 oz/week    2 Cans of beer per week  . Drug Use: No  . Sexual Activity: Yes   Other Topics Concern  . Not on file   Social History Narrative  . No narrative on file      Review of Systems  All other systems reviewed and are negative.      Objective:   Physical Exam  Vitals reviewed. Constitutional: He appears well-developed and well-nourished.   Neck: Neck supple. No JVD present. No thyromegaly present.  Cardiovascular: Normal rate, regular rhythm and normal heart sounds.  Exam reveals no gallop and no friction rub.   No murmur heard.  Pulmonary/Chest: Effort normal and breath sounds normal. No respiratory distress. He has no wheezes. He has no rales.  Abdominal: Soft. Bowel sounds are normal. He exhibits no distension. There is no tenderness. There is no rebound and no guarding.  Musculoskeletal: He exhibits no edema.  Lymphadenopathy:    He has no cervical adenopathy.          Assessment & Plan:  1. Insulin dependent type 2 diabetes mellitus, uncontrolled Continue Lantus 40 units subcutaneous daily. However I want him to switch his NovoLog from a standing dose of 5 units per meal. I will have the patient start compounding. Him to inject 2 units for every 15 g of carbs he consumes. I explained in detail how this works and the patient and he feels comfortable with this process. He is incontinent on his iron and trying to consume 60 g per meal anyway. Recheck sugars in 2 weeks.

## 2013-10-15 ENCOUNTER — Other Ambulatory Visit: Payer: Self-pay | Admitting: Family Medicine

## 2013-11-24 ENCOUNTER — Other Ambulatory Visit: Payer: Self-pay | Admitting: Family Medicine

## 2013-11-24 DIAGNOSIS — Z09 Encounter for follow-up examination after completed treatment for conditions other than malignant neoplasm: Secondary | ICD-10-CM

## 2013-11-24 MED ORDER — INSULIN ASPART 100 UNIT/ML FLEXPEN: 5.0000 [IU] | PEN_INJECTOR | Freq: Three times a day (TID) | SUBCUTANEOUS | Status: DC

## 2013-11-24 NOTE — Telephone Encounter (Signed)
Med sent to pharm 

## 2014-02-05 ENCOUNTER — Other Ambulatory Visit: Payer: Self-pay | Admitting: Family Medicine

## 2014-05-02 ENCOUNTER — Other Ambulatory Visit: Payer: Self-pay | Admitting: Family Medicine

## 2014-05-02 NOTE — Telephone Encounter (Signed)
Medication filled x1 with no refills.   Requires office visit before any further refills can be given.   Letter sent.  

## 2014-06-24 ENCOUNTER — Encounter: Payer: Self-pay | Admitting: Family Medicine

## 2014-06-24 ENCOUNTER — Ambulatory Visit (INDEPENDENT_AMBULATORY_CARE_PROVIDER_SITE_OTHER): Payer: Managed Care, Other (non HMO) | Admitting: Family Medicine

## 2014-06-24 VITALS — BP 128/64 | HR 68 | Temp 98.0°F | Resp 16 | Ht 67.0 in | Wt 172.0 lb

## 2014-06-24 DIAGNOSIS — E1165 Type 2 diabetes mellitus with hyperglycemia: Secondary | ICD-10-CM | POA: Diagnosis not present

## 2014-06-24 DIAGNOSIS — Z794 Long term (current) use of insulin: Secondary | ICD-10-CM

## 2014-06-24 DIAGNOSIS — IMO0002 Reserved for concepts with insufficient information to code with codable children: Secondary | ICD-10-CM

## 2014-06-24 LAB — COMPLETE METABOLIC PANEL WITH GFR
ALK PHOS: 68 U/L (ref 39–117)
ALT: 25 U/L (ref 0–53)
AST: 21 U/L (ref 0–37)
Albumin: 3.9 g/dL (ref 3.5–5.2)
BILIRUBIN TOTAL: 0.7 mg/dL (ref 0.2–1.2)
BUN: 12 mg/dL (ref 6–23)
CALCIUM: 9.4 mg/dL (ref 8.4–10.5)
CO2: 27 mEq/L (ref 19–32)
Chloride: 97 mEq/L (ref 96–112)
Creat: 0.8 mg/dL (ref 0.50–1.35)
Glucose, Bld: 248 mg/dL — ABNORMAL HIGH (ref 70–99)
Potassium: 5 mEq/L (ref 3.5–5.3)
SODIUM: 134 meq/L — AB (ref 135–145)
Total Protein: 6.4 g/dL (ref 6.0–8.3)

## 2014-06-24 LAB — HEMOGLOBIN A1C
Hgb A1c MFr Bld: 11.9 % — ABNORMAL HIGH (ref <5.7)
Mean Plasma Glucose: 295 mg/dL — ABNORMAL HIGH (ref <117)

## 2014-06-24 LAB — LIPID PANEL
CHOL/HDL RATIO: 4.6 ratio
CHOLESTEROL: 183 mg/dL (ref 0–200)
HDL: 40 mg/dL (ref >=40)
LDL Cholesterol: 122 mg/dL — ABNORMAL HIGH (ref 0–99)
Triglycerides: 104 mg/dL (ref <150)
VLDL: 21 mg/dL (ref 0–40)

## 2014-06-24 NOTE — Progress Notes (Signed)
Subjective:    Patient ID: Clifford Whitney, male    DOB: [birth year], 28 y.o.   MRN: [id number]  HPI Here for follow up of his diabetes.  Patient is a very pleasant 29 year old type I.5 diabetic who is insulin-dependent. He is currently using Lantus 30 units daily and NovoLog 10 units with meals. Unfortunately the patient is rarely checking his blood sugar.Marland Kitchen Unfortunately the patient is also somewhat noncompliant and does not take his disease seriously. He states his fasting blood sugar is around 180 but he is only checked it 1 or 2 times. His two-hour postprandial sugars are typically around 180 but I question how often he truly checking that. His blood pressure is controlled today. He is due for fasting lipid panel. Patient had a diabetic eye exam 6 months ago and this is normal. Past Medical History  Diagnosis Date  . Family history of anesthesia complication     "mom woke up on operating table"  . Type 1 diabetes mellitus     "actually type 1.5"  . Migraine     "a few times/yr" (08/10/2013)  . ADD (attention deficit disorder)     "bad!!!!!" (08/10/2013)   Past Surgical History  Procedure Laterality Date  . No past surgeries     Current Outpatient Prescriptions on File Prior to Visit  Medication Sig Dispense Refill  . B-D INS SYR ULTRAFINE .3CC/30G 30G X 1/2" 0.3 ML MISC USE ONCE A DAY. 100 each 5  . B-D UF III MINI PEN NEEDLES 31G X 5 MM MISC TEST 3 TIMES A DAY 100 each 1  . Blood Glucose Monitoring Suppl (ONE TOUCH ULTRA 2) W/DEVICE KIT Pt will need strips for checking BS tid (Disp. 100/5 refills) and lancets. (1 box/5refills) Dx - 250.00 - 1 each 0  . insulin aspart (NOVOLOG FLEXPEN) 100 UNIT/ML FlexPen Inject 5 Units into the skin 3 (three) times daily with meals. 45 mL 3  . LANTUS 100 UNIT/ML injection INJECT 40 UNITS INTO THE SKIN EVERY MORNING 10 mL 2  . lisinopril (PRINIVIL,ZESTRIL) 10 MG tablet TAKE 1 TABLET BY MOUTH EVERY DAY 30 tablet 0  . metFORMIN (GLUCOPHAGE) 1000 MG tablet  TAKE 1 TABLET BY MOUTH TWICE A DAY 180 tablet 0  . pioglitazone (ACTOS) 45 MG tablet Take 1 tablet (45 mg total) by mouth daily. 30 tablet 0  . pravastatin (PRAVACHOL) 40 MG tablet Take 1 tablet (40 mg total) by mouth daily. 30 tablet 0   No current facility-administered medications on file prior to visit.   Allergies  Allergen Reactions  . Aspirin Swelling    Reaction: Facial swelling  . Ibuprofen Swelling    Reaction: Facial swelling   History   Social History  . Marital Status: Legally Separated    Spouse Name: N/A  . Number of Children: N/A  . Years of Education: N/A   Occupational History  . Not on file.   Social History Main Topics  . Smoking status: Never Smoker   . Smokeless tobacco: Never Used  . Alcohol Use: 1.2 oz/week    2 Cans of beer per week  . Drug Use: No  . Sexual Activity: Yes   Other Topics Concern  . Not on file   Social History Narrative  . No narrative on file      Review of Systems  All other systems reviewed and are negative.      Objective:   Physical Exam  Constitutional: He appears well-developed and well-nourished. No distress.  Cardiovascular: Normal rate, regular rhythm, normal heart sounds and intact distal pulses.   No murmur heard. Pulmonary/Chest: Effort normal and breath sounds normal. No respiratory distress. He has no wheezes. He has no rales.  Abdominal: Soft. Bowel sounds are normal. He exhibits no distension and no mass. There is no tenderness. There is no rebound and no guarding.  Musculoskeletal: He exhibits no edema.  Skin: He is not diaphoretic.  Vitals reviewed.         Assessment & Plan:  Insulin dependent type 2 diabetes mellitus, uncontrolled - Plan: COMPLETE METABOLIC PANEL WITH GFR, Hemoglobin A1c, Lipid panel, Microalbumin, urine, CANCELED: Ambulatory referral to Ophthalmology  Diabetes is uncontrolled. This is mainly due to patient apathy and noncompliance. I will check a hemoglobin A1c. I have asked  the patient check his blood sugar fasting every morning and I would increase his Lantus until his fasting blood sugar is less than 130. I've also asked him to check his two-hour postprandial sugar every evening. I would increase his NovoLog dosage until his two-hour postprandial sugar is under 180. I will check a fasting lipid panel. Goal LDL cholesterol is less than 100. Patient's blood pressure is at goal. I will also check a urine microalbumin.

## 2014-06-25 LAB — MICROALBUMIN, URINE: MICROALB UR: 1 mg/dL (ref <2.0)

## 2014-07-04 ENCOUNTER — Other Ambulatory Visit: Payer: Self-pay | Admitting: Family Medicine

## 2014-07-04 DIAGNOSIS — E1165 Type 2 diabetes mellitus with hyperglycemia: Secondary | ICD-10-CM

## 2014-07-04 DIAGNOSIS — IMO0002 Reserved for concepts with insufficient information to code with codable children: Secondary | ICD-10-CM

## 2014-07-04 MED ORDER — ONETOUCH ULTRA 2 W/DEVICE KIT: PACK | Status: DC

## 2014-07-06 ENCOUNTER — Other Ambulatory Visit: Payer: Self-pay | Admitting: Family Medicine

## 2014-09-03 ENCOUNTER — Other Ambulatory Visit: Payer: Self-pay | Admitting: Family Medicine

## 2014-11-05 ENCOUNTER — Other Ambulatory Visit: Payer: Self-pay | Admitting: Family Medicine

## 2014-11-24 ENCOUNTER — Ambulatory Visit (INDEPENDENT_AMBULATORY_CARE_PROVIDER_SITE_OTHER): Payer: Managed Care, Other (non HMO) | Admitting: Family Medicine

## 2014-11-24 ENCOUNTER — Encounter: Payer: Self-pay | Admitting: Family Medicine

## 2014-11-24 VITALS — BP 126/84 | HR 86 | Temp 98.4°F | Resp 16 | Ht 67.0 in | Wt 190.0 lb

## 2014-11-24 DIAGNOSIS — Z7251 High risk heterosexual behavior: Secondary | ICD-10-CM

## 2014-11-24 DIAGNOSIS — Z23 Encounter for immunization: Secondary | ICD-10-CM | POA: Diagnosis not present

## 2014-11-24 DIAGNOSIS — Z0000 Encounter for general adult medical examination without abnormal findings: Secondary | ICD-10-CM

## 2014-11-24 NOTE — Progress Notes (Signed)
Subjective:    Patient ID: Clifford Whitney, male    DOB: 13-Mar-1985, 29 y.o.   MRN: [id number]  HPI 06/24/14 Here for follow up of his diabetes.  Patient is a very pleasant 29 year old type I.5 diabetic who is insulin-dependent. He is currently using Lantus 30 units daily and NovoLog 10 units with meals. Unfortunately the patient is rarely checking his blood sugar.Marland Kitchen Unfortunately the patient is also somewhat noncompliant and does not take his disease seriously. He states his fasting blood sugar is around 180 but he is only checked it 1 or 2 times. His two-hour postprandial sugars are typically around 180 but I question how often he truly checking that. His blood pressure is controlled today. He is due for fasting lipid panel. Patient had a diabetic eye exam 6 months ago and this is normal.  At that time, my plan was: Diabetes is uncontrolled. This is mainly due to patient apathy and noncompliance. I will check a hemoglobin A1c. I have asked the patient check his blood sugar fasting every morning and I would increase his Lantus until his fasting blood sugar is less than 130. I've also asked him to check his two-hour postprandial sugar every evening. I would increase his NovoLog dosage until his two-hour postprandial sugar is under 180. I will check a fasting lipid panel. Goal LDL cholesterol is less than 100. Patient's blood pressure is at goal. I will also check a urine microalbumin.  11/24/14 In June, HgA1c was 11.9 and I recommended that the patient see an endocrinologist due to my inability to control his sugars.  Last communication I have is from 8/16 and at that time, they placed him on toujeo and planned to uptitrate.  He is here today for a CPE.  His endocrinologist currently has him using 42 units of Lantus daily and then NovoLog sliding scale correction factor and carb counting with meals 3 times a day. Endocrinologist just checked hemoglobin A1c, CMP, fasting lipid panel last week and therefore I  will not repeat that today. He is due for a CBC. He is also in a new relationship. His girlfriend has told him that she has a history of herpes and he wanted to be tested because of some question of whether or not she acquired the infection from him.  He denies any history of genital rashes. He denies any penile discharge. Past Medical History  Diagnosis Date  . Family history of anesthesia complication     "mom woke up on operating table"  . Type 1 diabetes mellitus     "actually type 1.5"  . Migraine     "a few times/yr" (08/10/2013)  . ADD (attention deficit disorder)     "bad!!!!!" (08/10/2013)   Past Surgical History  Procedure Laterality Date  . No past surgeries     Current Outpatient Prescriptions on File Prior to Visit  Medication Sig Dispense Refill  . B-D INS SYR ULTRAFINE .3CC/30G 30G X 1/2" 0.3 ML MISC USE ONCE A DAY. 100 each 5  . B-D UF III MINI PEN NEEDLES 31G X 5 MM MISC TEST 3 TIMES A DAY 100 each 1  . Blood Glucose Monitoring Suppl (ONE TOUCH ULTRA 2) W/DEVICE KIT Pt will need strips for checking BS TID (Disp. 100/5 refills) and lancets. (1 box/5refills) Dx - 250.00 - 1 each 0  . LANTUS 100 UNIT/ML injection INJECT 40 UNITS INTO THE SKIN EVERY MORNING (Patient taking differently: INJECT 30 UNITS INTO THE SKIN EVERY MORNING) 10 mL  2  . lisinopril (PRINIVIL,ZESTRIL) 10 MG tablet TAKE 1 TABLET BY MOUTH EVERY DAY 30 tablet 11  . metFORMIN (GLUCOPHAGE) 1000 MG tablet TAKE 1 TABLET BY MOUTH TWICE A DAY 180 tablet 0  . NOVOLOG FLEXPEN 100 UNIT/ML FlexPen INJECT 5 UNITS INTO THE SKIN 3 (THREE) TIMES DAILY WITH MEALS 15 pen 3   No current facility-administered medications on file prior to visit.   Allergies  Allergen Reactions  . Aspirin Swelling    Reaction: Facial swelling  . Ibuprofen Swelling    Reaction: Facial swelling   Social History   Social History  . Marital Status: Legally Separated    Spouse Name: N/A  . Number of Children: N/A  . Years of Education: N/A     Occupational History  . Not on file.   Social History Main Topics  . Smoking status: Never Smoker   . Smokeless tobacco: Never Used  . Alcohol Use: 1.2 oz/week    2 Cans of beer per week  . Drug Use: No  . Sexual Activity: Yes   Other Topics Concern  . Not on file   Social History Narrative   No family history on file.    Review of Systems  All other systems reviewed and are negative.      Objective:   Physical Exam  Constitutional: He is oriented to person, place, and time. He appears well-developed and well-nourished. No distress.  HENT:  Head: Normocephalic and atraumatic.  Right Ear: External ear normal.  Left Ear: External ear normal.  Nose: Nose normal.  Mouth/Throat: Oropharynx is clear and moist. No oropharyngeal exudate.  Eyes: Conjunctivae and EOM are normal. Pupils are equal, round, and reactive to light. Right eye exhibits no discharge. Left eye exhibits no discharge. No scleral icterus.  Neck: Normal range of motion. Neck supple. No JVD present. No tracheal deviation present. No thyromegaly present.  Cardiovascular: Normal rate, regular rhythm, normal heart sounds and intact distal pulses.   No murmur heard. Pulmonary/Chest: Effort normal and breath sounds normal. No stridor. No respiratory distress. He has no wheezes. He has no rales.  Abdominal: Soft. Bowel sounds are normal. He exhibits no distension and no mass. There is no tenderness. There is no rebound and no guarding.  Musculoskeletal: He exhibits no edema or tenderness.  Lymphadenopathy:    He has no cervical adenopathy.  Neurological: He is alert and oriented to person, place, and time. He has normal reflexes. He displays normal reflexes. No cranial nerve deficit. He exhibits normal muscle tone. Coordination normal.  Skin: Skin is warm. No rash noted. He is not diaphoretic. No erythema. No pallor.  Psychiatric: He has a normal mood and affect. His behavior is normal. Judgment and thought content  normal.  Vitals reviewed.         Assessment & Plan:  Routine general medical examination at a health care facility - Plan: CBC with Differential/Platelet  High risk sexual behavior - Plan: HSV(herpes smplx)abs-1+2(IgG+IgM)-bld  Physical exam today is normal. Blood pressure is well controlled. Endocrinologist is currently managing diabetes and also recently adjusted the pravastatin based on lab work they checked last week. I will defer this to their judgment. Patient received his flu shot today. I will check a CBC to complete his general physical lab work. I will also check the patient for antibody titers to HSV-1 and HSV-2. Regular anticipatory guidance is provided.

## 2014-11-24 NOTE — Addendum Note (Signed)
Addended by: Legrand RamsWILLIS, Yerik Zeringue B on: 11/24/2014 04:49 PM   Modules accepted: Orders

## 2014-11-25 LAB — CBC WITH DIFFERENTIAL/PLATELET
Basophils Absolute: 0 10*3/uL (ref 0.0–0.1)
Basophils Relative: 0 % (ref 0–1)
EOS ABS: 0.2 10*3/uL (ref 0.0–0.7)
EOS PCT: 3 % (ref 0–5)
HEMATOCRIT: 45.6 % (ref 39.0–52.0)
Hemoglobin: 15.1 g/dL (ref 13.0–17.0)
LYMPHS ABS: 3.1 10*3/uL (ref 0.7–4.0)
LYMPHS PCT: 40 % (ref 12–46)
MCH: 29.2 pg (ref 26.0–34.0)
MCHC: 33.1 g/dL (ref 30.0–36.0)
MCV: 88.2 fL (ref 78.0–100.0)
MONO ABS: 0.7 10*3/uL (ref 0.1–1.0)
MPV: 10.5 fL (ref 8.6–12.4)
Monocytes Relative: 9 % (ref 3–12)
Neutro Abs: 3.7 10*3/uL (ref 1.7–7.7)
Neutrophils Relative %: 48 % (ref 43–77)
Platelets: 385 10*3/uL (ref 150–400)
RBC: 5.17 MIL/uL (ref 4.22–5.81)
RDW: 12.8 % (ref 11.5–15.5)
WBC: 7.7 10*3/uL (ref 4.0–10.5)

## 2014-11-25 LAB — HSV(HERPES SMPLX)ABS-I+II(IGG+IGM)-BLD
HSV 1 Glycoprotein G Ab, IgG: 7.39 IV — ABNORMAL HIGH
HSV 2 Glycoprotein G Ab, IgG: 8.36 IV — ABNORMAL HIGH
Herpes Simplex Vrs I&II-IgM Ab (EIA): 2.03 INDEX — ABNORMAL HIGH

## 2015-02-03 ENCOUNTER — Telehealth: Payer: Self-pay | Admitting: Family Medicine

## 2015-02-03 MED ORDER — ONETOUCH VERIO VI STRP: ORAL_STRIP | Status: DC

## 2015-02-03 NOTE — Telephone Encounter (Signed)
Medication called/sent to requested pharmacy  

## 2015-02-03 NOTE — Telephone Encounter (Signed)
Patient calling to get refill sent to [facility] [address] for his test strips  [phone]

## 2015-02-20 ENCOUNTER — Telehealth: Payer: Self-pay | Admitting: *Deleted

## 2015-02-20 MED ORDER — ONETOUCH VERIO VI STRP: ORAL_STRIP | Status: DC

## 2015-02-20 NOTE — Telephone Encounter (Signed)
Received fax stating needs refill on One touch Ultra test strips for 90 days, script sent to pharmacy

## 2015-02-23 ENCOUNTER — Telehealth: Payer: Self-pay | Admitting: Family Medicine

## 2015-02-23 NOTE — Telephone Encounter (Addendum)
Pt tried to contact his doctor that usually calls in the needles for his Novalog and Lantis flexpens but he was unable to.  He wants to know if Dr. Tanya Nones could call them in to [facility] [address].

## 2015-02-24 MED ORDER — INSULIN PEN NEEDLE 31G X 5 MM MISC: Status: AC

## 2015-02-24 NOTE — Telephone Encounter (Signed)
ok 

## 2015-02-24 NOTE — Telephone Encounter (Signed)
Sent needles to pharm

## 2015-02-24 NOTE — Telephone Encounter (Signed)
OK for [place] to refill - LOV with Endocrin was 12/16

## 2015-03-21 ENCOUNTER — Other Ambulatory Visit: Payer: Self-pay | Admitting: Family Medicine

## 2015-03-21 MED ORDER — GLUCOSE BLOOD VI STRP: 1.0000 | ORAL_STRIP | Freq: Three times a day (TID) | Status: DC

## 2015-03-21 NOTE — Telephone Encounter (Signed)
Test strips refilled for 90 days as requested

## 2015-03-27 DIAGNOSIS — E139 Other specified diabetes mellitus without complications: Secondary | ICD-10-CM | POA: Insufficient documentation

## 2015-03-27 DIAGNOSIS — E782 Mixed hyperlipidemia: Secondary | ICD-10-CM | POA: Insufficient documentation

## 2015-04-03 ENCOUNTER — Telehealth: Payer: Self-pay | Admitting: Family Medicine

## 2015-04-03 MED ORDER — INSULIN GLARGINE 100 UNIT/ML ~~LOC~~ SOLN: SUBCUTANEOUS | Status: DC

## 2015-04-03 NOTE — Telephone Encounter (Signed)
Medication called/sent to requested pharmacy  

## 2015-04-03 NOTE — Telephone Encounter (Signed)
Patient calling in requesting refill on his LANTUS 100 UNIT/ML injection [facility] uses [facility] [address]  CB#[phone]

## 2015-04-12 ENCOUNTER — Telehealth: Payer: Self-pay | Admitting: Family Medicine

## 2015-04-12 MED ORDER — BASAGLAR KWIKPEN 100 UNIT/ML ~~LOC~~ SOPN: 42.0000 [IU] | PEN_INJECTOR | SUBCUTANEOUS | Status: DC

## 2015-04-12 NOTE — Telephone Encounter (Signed)
Insurance will not cover Lantus - per [facility] will change to basaglar. New rx sent to [facility]

## 2015-05-08 ENCOUNTER — Other Ambulatory Visit: Payer: Self-pay | Admitting: *Deleted

## 2015-05-08 MED ORDER — BASAGLAR KWIKPEN 100 UNIT/ML ~~LOC~~ SOPN: 42.0000 [IU] | PEN_INJECTOR | SUBCUTANEOUS | Status: DC

## 2015-05-08 NOTE — Telephone Encounter (Signed)
Received fax requesting refill on Basaglar.   Refill appropriate and filled per protocol.

## 2015-08-03 ENCOUNTER — Other Ambulatory Visit: Payer: Self-pay | Admitting: Family Medicine

## 2015-12-22 ENCOUNTER — Encounter: Payer: Self-pay | Admitting: Family Medicine

## 2015-12-22 ENCOUNTER — Other Ambulatory Visit: Payer: Self-pay | Admitting: Family Medicine

## 2015-12-22 MED ORDER — LISINOPRIL 10 MG PO TABS: 10.0000 mg | ORAL_TABLET | Freq: Every day | ORAL | 0 refills | Status: DC

## 2015-12-22 NOTE — Telephone Encounter (Signed)
Pharmacy requested 90 day refill on Lisinopril - this was denied as pt need ov and labs. Did refill for 30 days until pt can be seen - will send letter.

## 2016-02-12 DIAGNOSIS — E663 Overweight: Secondary | ICD-10-CM | POA: Insufficient documentation

## 2016-02-19 ENCOUNTER — Other Ambulatory Visit: Payer: Self-pay | Admitting: Family Medicine

## 2016-05-03 IMAGING — CR DG CHEST 2V
2 series · 2 of 2 positions shown · non-contrast
Comparison: None.

CLINICAL DATA: Diabetes and cough.  Possible pneumonia.

EXAM:
CHEST  2 VIEW

[w chest lat]
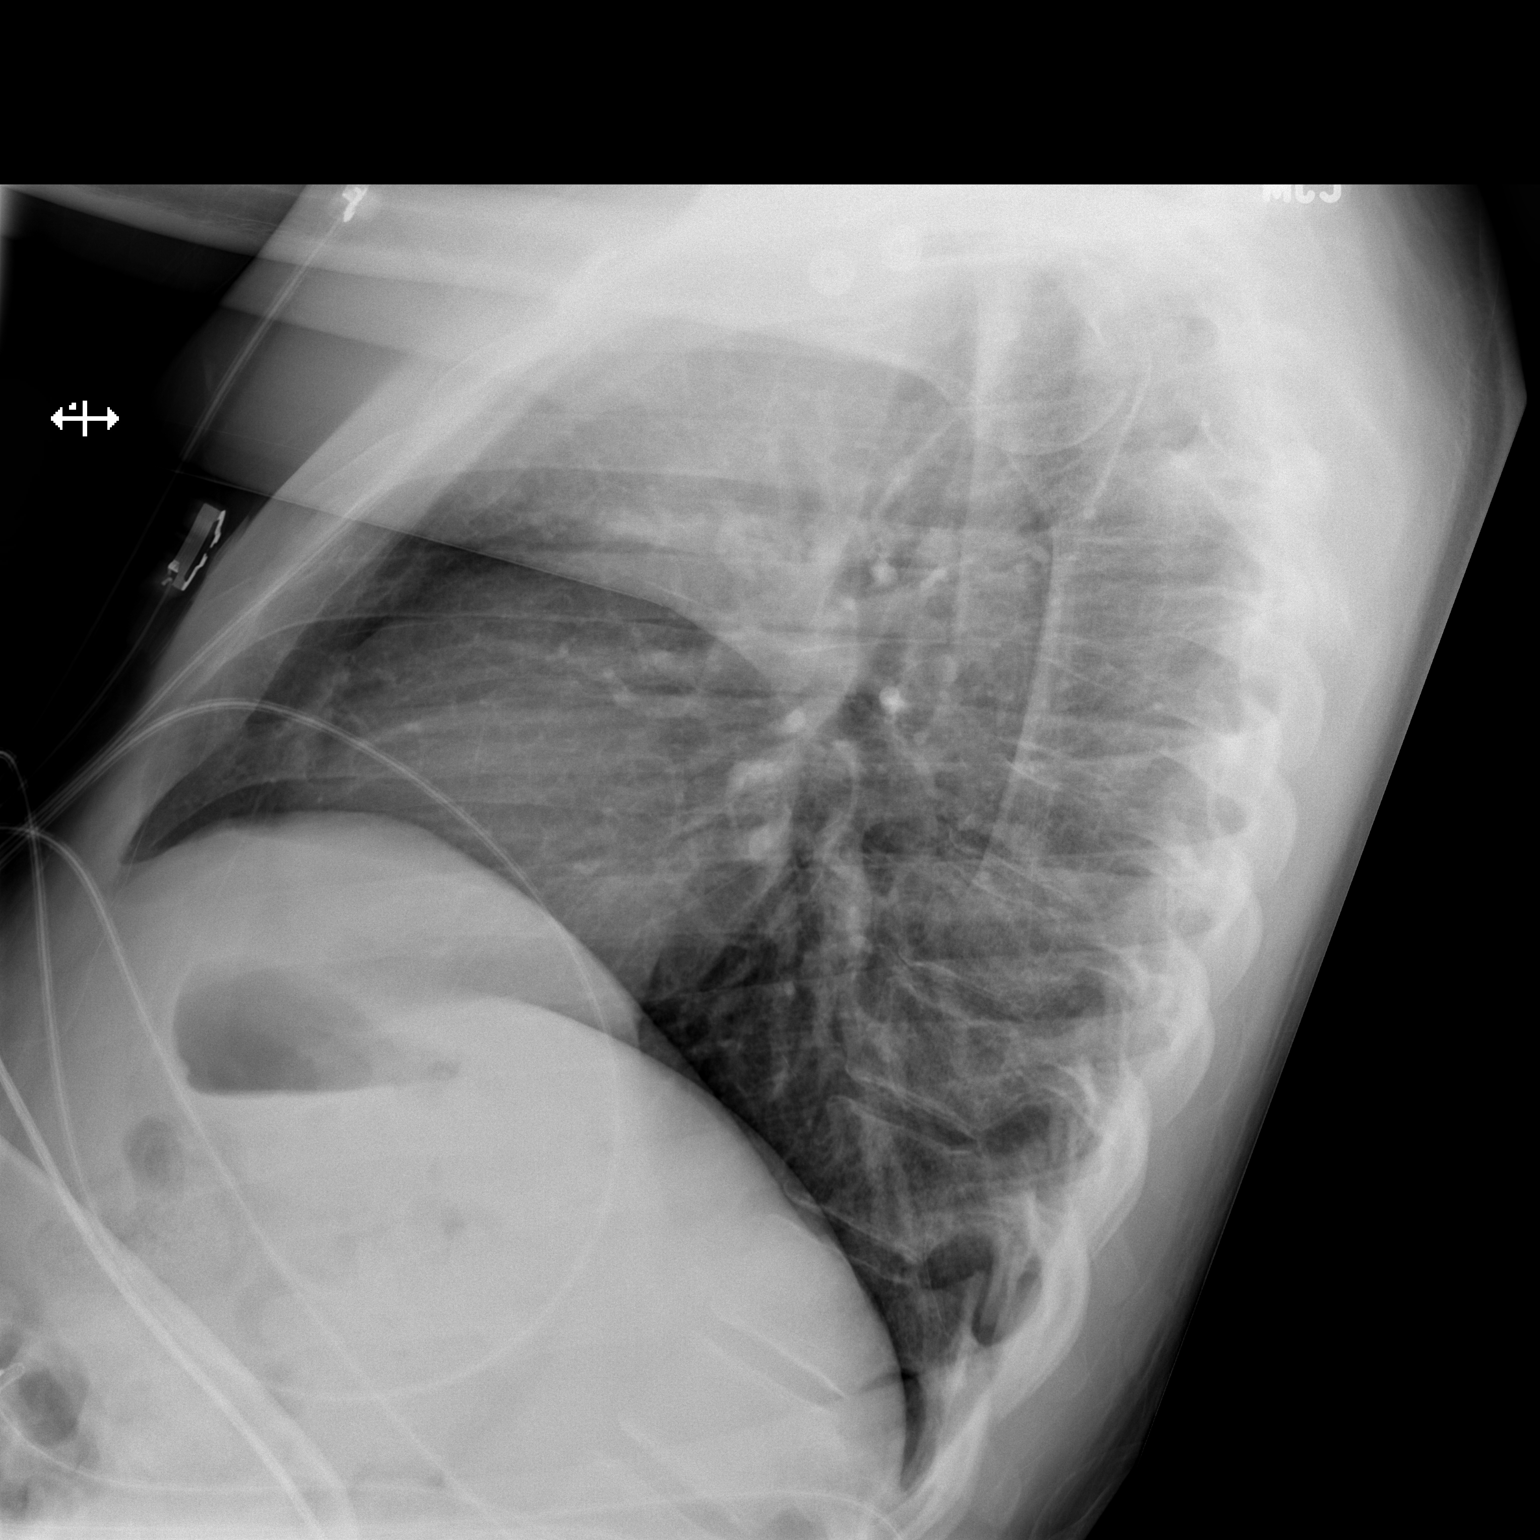

[x chest ap]
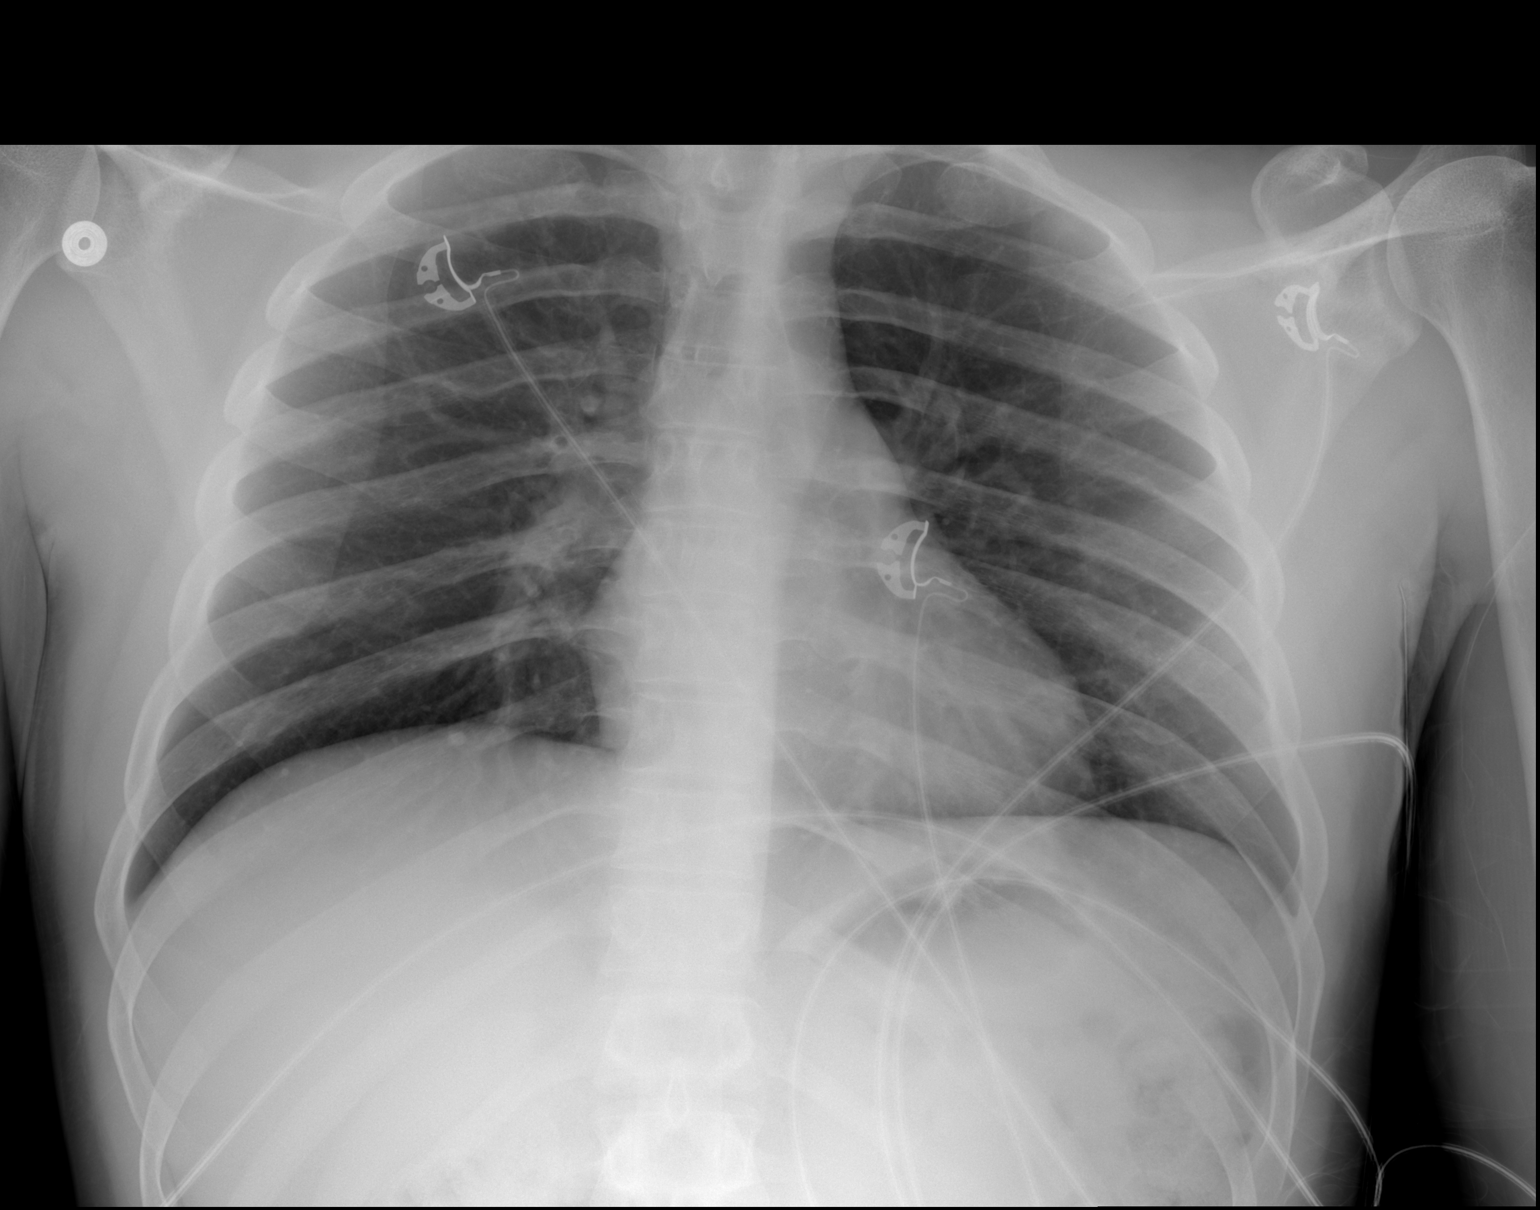

[2 of 2 positions shown; findings below may reference images not displayed]

FINDINGS: The heart size and mediastinal contours are within normal limits.
Both lungs are clear. The visualized skeletal structures are
unremarkable.
IMPRESSION: No active cardiopulmonary disease.

## 2016-06-11 DIAGNOSIS — R7989 Other specified abnormal findings of blood chemistry: Secondary | ICD-10-CM | POA: Insufficient documentation

## 2016-06-13 ENCOUNTER — Other Ambulatory Visit: Payer: Self-pay | Admitting: Family Medicine

## 2020-05-30 DIAGNOSIS — R7989 Other specified abnormal findings of blood chemistry: Secondary | ICD-10-CM | POA: Diagnosis not present

## 2020-05-30 DIAGNOSIS — Z7984 Long term (current) use of oral hypoglycemic drugs: Secondary | ICD-10-CM | POA: Diagnosis not present

## 2020-05-30 DIAGNOSIS — E139 Other specified diabetes mellitus without complications: Secondary | ICD-10-CM | POA: Diagnosis not present

## 2020-05-30 DIAGNOSIS — E782 Mixed hyperlipidemia: Secondary | ICD-10-CM | POA: Diagnosis not present

## 2020-10-05 DIAGNOSIS — R7989 Other specified abnormal findings of blood chemistry: Secondary | ICD-10-CM | POA: Diagnosis not present

## 2020-10-05 DIAGNOSIS — Z7984 Long term (current) use of oral hypoglycemic drugs: Secondary | ICD-10-CM | POA: Diagnosis not present

## 2020-10-05 DIAGNOSIS — E785 Hyperlipidemia, unspecified: Secondary | ICD-10-CM | POA: Diagnosis not present

## 2020-10-05 DIAGNOSIS — Z794 Long term (current) use of insulin: Secondary | ICD-10-CM | POA: Diagnosis not present

## 2020-10-05 DIAGNOSIS — E139 Other specified diabetes mellitus without complications: Secondary | ICD-10-CM | POA: Diagnosis not present

## 2020-10-05 DIAGNOSIS — R946 Abnormal results of thyroid function studies: Secondary | ICD-10-CM | POA: Diagnosis not present

## 2021-02-05 DIAGNOSIS — E782 Mixed hyperlipidemia: Secondary | ICD-10-CM | POA: Diagnosis not present

## 2021-02-05 DIAGNOSIS — Z794 Long term (current) use of insulin: Secondary | ICD-10-CM | POA: Diagnosis not present

## 2021-02-05 DIAGNOSIS — E1365 Other specified diabetes mellitus with hyperglycemia: Secondary | ICD-10-CM | POA: Diagnosis not present

## 2021-02-05 DIAGNOSIS — Z7984 Long term (current) use of oral hypoglycemic drugs: Secondary | ICD-10-CM | POA: Diagnosis not present

## 2021-02-05 DIAGNOSIS — R7989 Other specified abnormal findings of blood chemistry: Secondary | ICD-10-CM | POA: Diagnosis not present

## 2021-04-23 DIAGNOSIS — L237 Allergic contact dermatitis due to plants, except food: Secondary | ICD-10-CM | POA: Diagnosis not present

## 2021-08-06 DIAGNOSIS — R7989 Other specified abnormal findings of blood chemistry: Secondary | ICD-10-CM | POA: Diagnosis not present

## 2021-08-06 DIAGNOSIS — Z7984 Long term (current) use of oral hypoglycemic drugs: Secondary | ICD-10-CM | POA: Diagnosis not present

## 2021-08-06 DIAGNOSIS — E782 Mixed hyperlipidemia: Secondary | ICD-10-CM | POA: Diagnosis not present

## 2021-08-06 DIAGNOSIS — Z794 Long term (current) use of insulin: Secondary | ICD-10-CM | POA: Diagnosis not present

## 2021-08-06 DIAGNOSIS — E139 Other specified diabetes mellitus without complications: Secondary | ICD-10-CM | POA: Diagnosis not present

## 2021-11-01 ENCOUNTER — Ambulatory Visit: Payer: Self-pay | Admitting: Family Medicine

## 2021-11-01 VITALS — BP 120/72 | HR 89 | Temp 97.9°F | Ht 67.0 in | Wt 188.0 lb

## 2021-11-01 DIAGNOSIS — J320 Chronic maxillary sinusitis: Secondary | ICD-10-CM | POA: Diagnosis not present

## 2021-11-01 MED ORDER — AMOXICILLIN-POT CLAVULANATE 875-125 MG PO TABS: 1.0000 | ORAL_TABLET | Freq: Two times a day (BID) | ORAL | 0 refills | Status: DC

## 2021-11-01 NOTE — Progress Notes (Signed)
Subjective:    Patient ID: Clifford Whitney, male    DOB: 1985-06-10, 36 y.o.   MRN: [id number]  Sore Throat   Patient presents today with a sore throat no loss of voice.  However he reports head congestion that has been off and on present for 3 years.  He states that he is constantly dealing with pressure in his sinuses.  This is primarily his maxillary sinuses right greater than left.  He also reports postnasal drip and drainage despite taking Nasonex and Flonase and Zyrtec.  Few days ago he lost his voice.  He took a negative COVID test. Past Medical History  Diagnosis Date   Family history of anesthesia complication     "mom woke up on operating table"   Type 1 diabetes mellitus     "actually type 1.5"   Migraine     "a few times/yr" (08/10/2013)   ADD (attention deficit disorder)     "bad!!!!!" (08/10/2013)   Past Surgical History  Procedure Laterality Date   No past surgeries     Current Outpatient Prescriptions on File Prior to Visit  Medication Sig Dispense Refill   B-D INS SYR ULTRAFINE .3CC/30G 30G X 1/2" 0.3 ML MISC USE ONCE A DAY. 100 each 5   B-D UF III MINI PEN NEEDLES 31G X 5 MM MISC TEST 3 TIMES A DAY 100 each 1   Blood Glucose Monitoring Suppl (ONE TOUCH ULTRA 2) W/DEVICE KIT Pt will need strips for checking BS tid (Disp. 100/5 refills) and lancets. (1 box/5refills) Dx - 250.00 - 1 each 0   insulin aspart (NOVOLOG FLEXPEN) 100 UNIT/ML FlexPen Inject 5 Units into the skin 3 (three) times daily with meals. 45 mL 3   LANTUS 100 UNIT/ML injection INJECT 40 UNITS INTO THE SKIN EVERY MORNING 10 mL 2   lisinopril (PRINIVIL,ZESTRIL) 10 MG tablet TAKE 1 TABLET BY MOUTH EVERY DAY 30 tablet 0   metFORMIN (GLUCOPHAGE) 1000 MG tablet TAKE 1 TABLET BY MOUTH TWICE A DAY 180 tablet 0   pioglitazone (ACTOS) 45 MG tablet Take 1 tablet (45 mg total) by mouth daily. 30 tablet 0   pravastatin (PRAVACHOL) 40 MG tablet Take 1 tablet (40 mg total) by mouth daily. 30 tablet 0   No current  facility-administered medications on file prior to visit.   Allergies  Allergen Reactions   Aspirin Swelling    Reaction: Facial swelling   Ibuprofen Swelling    Reaction: Facial swelling   History   Social History   Marital Status: Legally Separated    Spouse Name: N/A   Number of Children: N/A   Years of Education: N/A   Occupational History   Not on file.   Social History Main Topics   Smoking status: Never Smoker    Smokeless tobacco: Never Used   Alcohol Use: 1.2 oz/week    2 Cans of beer per week   Drug Use: No   Sexual Activity: Yes   Other Topics Concern   Not on file   Social History Narrative   No narrative on file      Review of Systems  All other systems reviewed and are negative.      Objective:   Physical Exam Constitutional:      Appearance: He is well-developed.  HENT:     Right Ear: Tympanic membrane and ear canal normal.     Left Ear: Tympanic membrane and ear canal normal.     Nose: Congestion and rhinorrhea  present.     Right Sinus: Maxillary sinus tenderness present.     Left Sinus: Maxillary sinus tenderness present.     Mouth/Throat:     Pharynx: No oropharyngeal exudate or posterior oropharyngeal erythema.  Eyes:     Conjunctiva/sclera: Conjunctivae normal.  Cardiovascular:     Rate and Rhythm: Normal rate and regular rhythm.     Heart sounds: Normal heart sounds. No murmur heard. Pulmonary:     Effort: Pulmonary effort is normal. No respiratory distress.     Breath sounds: Normal breath sounds. No wheezing or rales.  Musculoskeletal:     Cervical back: Neck supple.  Lymphadenopathy:     Cervical: No cervical adenopathy.  Neurological:     Mental Status: He is alert.           Assessment & Plan:  Chronic maxillary sinusitis I believe the patient likely has laryngitis and a sore throat due to a virus.  I recommended tincture of time.  However I believe he may be dealing with sinusitis as given the intermittent  pressure in his maxillary sinuses and pain that has been going on now for months.  We will try the patient on Augmentin 875 mg twice daily for 10 days and then reassess

## 2021-11-29 ENCOUNTER — Ambulatory Visit (INDEPENDENT_AMBULATORY_CARE_PROVIDER_SITE_OTHER): Payer: BC Managed Care – PPO | Admitting: Family Medicine

## 2021-11-29 ENCOUNTER — Other Ambulatory Visit (INDEPENDENT_AMBULATORY_CARE_PROVIDER_SITE_OTHER): Payer: BC Managed Care – PPO | Admitting: Family Medicine

## 2021-11-29 VITALS — BP 120/72 | HR 91 | Ht 67.0 in | Wt 193.2 lb

## 2021-11-29 DIAGNOSIS — Z0000 Encounter for general adult medical examination without abnormal findings: Secondary | ICD-10-CM

## 2021-11-29 DIAGNOSIS — Z794 Long term (current) use of insulin: Secondary | ICD-10-CM | POA: Diagnosis not present

## 2021-11-29 DIAGNOSIS — E1165 Type 2 diabetes mellitus with hyperglycemia: Secondary | ICD-10-CM

## 2021-11-29 LAB — LIPID PANEL
Cholesterol: 165 mg/dL
HDL: 52 mg/dL
LDL Cholesterol (Calc): 94 mg/dL
Non-HDL Cholesterol (Calc): 113 mg/dL
Total CHOL/HDL Ratio: 3.2 (calc)
Triglycerides: 91 mg/dL

## 2021-11-29 LAB — HEMOGLOBIN A1C
Hgb A1c MFr Bld: 9.2 % of total Hgb — ABNORMAL HIGH (ref <5.7)
Mean Plasma Glucose: 217 mg/dL
eAG (mmol/L): 12 mmol/L

## 2021-11-29 LAB — COMPLETE METABOLIC PANEL WITHOUT GFR
AG Ratio: 1.7 (calc) (ref 1.0–2.5)
ALT: 23 U/L (ref 9–46)
AST: 20 U/L (ref 10–40)
Albumin: 4.2 g/dL (ref 3.6–5.1)
Alkaline phosphatase (APISO): 61 U/L (ref 36–130)
BUN: 14 mg/dL (ref 7–25)
CO2: 29 mmol/L (ref 20–32)
Calcium: 9.5 mg/dL (ref 8.6–10.3)
Chloride: 100 mmol/L (ref 98–110)
Creat: 0.82 mg/dL (ref 0.60–1.26)
Globulin: 2.5 g/dL (ref 1.9–3.7)
Glucose, Bld: 85 mg/dL (ref 65–99)
Potassium: 4.3 mmol/L (ref 3.5–5.3)
Sodium: 139 mmol/L (ref 135–146)
Total Bilirubin: 0.3 mg/dL (ref 0.2–1.2)
Total Protein: 6.7 g/dL (ref 6.1–8.1)
eGFR: 117 mL/min/{1.73_m2}

## 2021-11-29 LAB — CBC WITH DIFFERENTIAL/PLATELET
Absolute Monocytes: 649 cells/uL (ref 200–950)
Basophils Absolute: 63 cells/uL (ref 0–200)
Basophils Relative: 1 %
Eosinophils Absolute: 498 cells/uL (ref 15–500)
Eosinophils Relative: 7.9 %
HCT: 44.4 % (ref 38.5–50.0)
Hemoglobin: 15 g/dL (ref 13.2–17.1)
Lymphs Abs: 2564 cells/uL (ref 850–3900)
MCH: 29.6 pg (ref 27.0–33.0)
MCHC: 33.8 g/dL (ref 32.0–36.0)
MCV: 87.6 fL (ref 80.0–100.0)
MPV: 10.4 fL (ref 7.5–12.5)
Monocytes Relative: 10.3 %
Neutro Abs: 2526 cells/uL (ref 1500–7800)
Neutrophils Relative %: 40.1 %
Platelets: 377 10*3/uL (ref 140–400)
RBC: 5.07 10*6/uL (ref 4.20–5.80)
RDW: 12.8 % (ref 11.0–15.0)
Total Lymphocyte: 40.7 %
WBC: 6.3 10*3/uL (ref 3.8–10.8)

## 2021-11-29 LAB — PROTEIN / CREATININE RATIO, URINE
Creatinine, Urine: 130 mg/dL (ref 20–320)
Protein/Creat Ratio: 115 mg/g creat (ref 25–148)
Protein/Creatinine Ratio: 0.115 mg/mg creat (ref 0.025–0.148)
Total Protein, Urine: 15 mg/dL (ref 5–25)

## 2021-11-29 MED ORDER — TERBINAFINE HCL 250 MG PO TABS: 250.0000 mg | ORAL_TABLET | Freq: Every day | ORAL | 2 refills | Status: DC

## 2021-11-29 NOTE — Progress Notes (Signed)
Subjective:    Patient ID: Clifford Whitney, male    DOB: 1985-12-11, 36 y.o.   MRN: [id number]  Patient is a very pleasant 36 year old Caucasian gentleman here today for complete physical exam.  He is due for a flu shot, the COVID booster, as well as an RSV vaccine.  He declines all of these today.  His tetanus shot is up-to-date and is not due again until 2025.  He sees endocrinology has been checking his A1c although he is not sure the last time he had his cholesterol checked or any other additional lab work.  He saw his eye doctor within the last year and there is no evidence of any diabetic retinopathy.  He denies any neuropathy in his feet and his diabetic foot exam is significant only for onychomycosis Past Medical History:  Diagnosis Date   ADD (attention deficit disorder)    "bad!!!!!" (08/10/2013)   Family history of anesthesia complication    "mom woke up on operating table"   Migraine    "a few times/yr" (08/10/2013)   Type 1 diabetes mellitus (HCC)    "actually type 1.5"   Past Surgical History:  Procedure Laterality Date   NO PAST SURGERIES     Current Outpatient Medications on File Prior to Visit  Medication Sig Dispense Refill   B-D INS SYR ULTRAFINE .3CC/30G 30G X 1/2" 0.3 ML MISC USE ONCE A DAY. 100 each 5   Dapagliflozin Pro-metFORMIN ER (XIGDUO XR) 05-998 MG TB24 Take 5 each by mouth.     insulin glargine (LANTUS) 100 UNIT/ML injection Inject 35 Units into the skin daily.     Insulin Pen Needle (B-D UF III MINI PEN NEEDLES) 31G X 5 MM MISC TEST 3 TIMES A DAY 100 each 1   lisinopril (PRINIVIL,ZESTRIL) 10 MG tablet TAKE 1 TABLET BY MOUTH EVERY DAY 30 tablet 0   NOVOLOG FLEXPEN 100 UNIT/ML FlexPen INJECT 5 UNITS INTO THE SKIN 3 (THREE) TIMES DAILY WITH MEALS (Patient taking differently: INJECT  UNITS INTO THE SKIN 3 (THREE) TIMES DAILY WITH MEALS on sliding scale) 15 pen 3   pravastatin (PRAVACHOL) 20 MG tablet Take 20 mg by mouth daily.     No current facility-administered  medications on file prior to visit.       Allergies  Allergen Reactions   Aspirin Swelling    Reaction: Facial swelling   Ibuprofen Swelling    Reaction: Facial swelling   Social History   Socioeconomic History   Marital status: Legally Separated    Spouse name: Not on file   Number of children: Not on file   Years of education: Not on file   Highest education level: Not on file  Occupational History   Not on file  Tobacco Use   Smoking status: Never   Smokeless tobacco: Never  Substance and Sexual Activity   Alcohol use: Yes    Alcohol/week: 2.0 standard drinks of alcohol    Types: 2 Cans of beer per week   Drug use: No   Sexual activity: Yes  Other Topics Concern   Not on file  Social History Narrative   Not on file   Social Determinants of Health   Financial Resource Strain: Not on file  Food Insecurity: Not on file  Transportation Needs: Not on file  Physical Activity: Not on file  Stress: Not on file  Social Connections: Not on file  Intimate Partner Violence: Not on file      Review of Systems  All other systems reviewed and are negative.      Objective:   Physical Exam Constitutional:      General: He is not in acute distress.    Appearance: Normal appearance. He is well-developed and normal weight. He is not ill-appearing or toxic-appearing.  HENT:     Head: Normocephalic and atraumatic.     Right Ear: Tympanic membrane and ear canal normal.     Left Ear: Tympanic membrane and ear canal normal.     Mouth/Throat:     Mouth: Mucous membranes are moist.     Pharynx: Oropharynx is clear. No oropharyngeal exudate or posterior oropharyngeal erythema.  Eyes:     General: No scleral icterus.       Right eye: No discharge.        Left eye: No discharge.     Extraocular Movements: Extraocular movements intact.     Conjunctiva/sclera: Conjunctivae normal.     Pupils: Pupils are equal, round, and reactive to light.  Neck:     Vascular: No carotid  bruit.  Cardiovascular:     Rate and Rhythm: Normal rate and regular rhythm.     Pulses: Normal pulses.     Heart sounds: Normal heart sounds. No murmur heard.    No friction rub. No gallop.  Pulmonary:     Effort: Pulmonary effort is normal. No respiratory distress.     Breath sounds: Normal breath sounds. No stridor. No wheezing, rhonchi or rales.  Chest:     Chest wall: No tenderness.  Abdominal:     General: Bowel sounds are normal. There is no distension.     Palpations: Abdomen is soft. There is no mass.     Tenderness: There is no abdominal tenderness. There is no guarding or rebound.     Hernia: No hernia is present.  Musculoskeletal:        General: No deformity. Normal range of motion.     Cervical back: Normal range of motion and neck supple. No rigidity or tenderness.     Right lower leg: No edema.     Left lower leg: No edema.  Lymphadenopathy:     Cervical: No cervical adenopathy.  Skin:    General: Skin is warm.     Coloration: Skin is not jaundiced or pale.     Findings: No bruising, erythema, lesion or rash.  Neurological:     General: No focal deficit present.     Mental Status: He is alert and oriented to person, place, and time. Mental status is at baseline.     Cranial Nerves: No cranial nerve deficit.     Sensory: No sensory deficit.     Motor: No weakness.     Coordination: Coordination normal.     Gait: Gait normal.     Deep Tendon Reflexes: Reflexes normal.  Psychiatric:        Mood and Affect: Mood normal.        Behavior: Behavior normal.        Thought Content: Thought content normal.        Judgment: Judgment normal.           Assessment & Plan:  General medical exam  Type 2 diabetes mellitus with hyperglycemia, with long-term current use of insulin (HCC) Diabetic foot exam was performed today and is normal.  Blood pressure is outstanding.  Recommended a flu shot, COVID shot, and we discussed RSV.  I will check a CBC a CMP a lipid panel  and a urine protein creatinine ratio, as well as a hemoglobin A1c.  Cancer screening is not yet due.  I will treat onychomycosis with Lamisil 250 mg daily for 3 months.  Recommended checking liver function test monthly while taking the medication.

## 2021-11-29 NOTE — Progress Notes (Signed)
Subjective:    Patient ID: Clifford Whitney, male    DOB: [birth year], 36 y.o.   MRN: [id number]   Past Medical History:  Diagnosis Date  . ADD (attention deficit disorder)    "bad!!!!!" (08/10/2013)  . Family history of anesthesia complication    "mom woke up on operating table"  . Migraine    "a few times/yr" (08/10/2013)  . Type 1 diabetes mellitus ([place])    "actually type 1.5"   Past Surgical History:  Procedure Laterality Date  . NO PAST SURGERIES     Current Outpatient Medications on File Prior to Visit  Medication Sig Dispense Refill  . B-D INS SYR ULTRAFINE .3CC/30G 30G X 1/2" 0.3 ML MISC USE ONCE A DAY. 100 each 5  . Blood Glucose Monitoring Suppl (ONE TOUCH ULTRA 2) W/DEVICE KIT Pt will need strips for checking BS TID (Disp. 100/5 refills) and lancets. (1 box/5refills) Dx - 250.00 - 1 each 0  . glucose blood test strip 1 each by Other route 4 (four) times daily -  before meals and at bedtime. Use as instructed 400 each 3  . Insulin Glargine (BASAGLAR KWIKPEN) 100 UNIT/ML SOPN Inject 0.42 mLs (42 Units total) into the skin every morning. 15 pen 3  . Insulin Pen Needle (B-D UF III MINI PEN NEEDLES) 31G X 5 MM MISC TEST 3 TIMES A DAY 100 each 1  . lisinopril (PRINIVIL,ZESTRIL) 10 MG tablet TAKE 1 TABLET BY MOUTH EVERY DAY 30 tablet 0  . metFORMIN (GLUCOPHAGE) 1000 MG tablet TAKE 1 TABLET BY MOUTH TWICE A DAY 180 tablet 0  . NOVOLOG FLEXPEN 100 UNIT/ML FlexPen INJECT 5 UNITS INTO THE SKIN 3 (THREE) TIMES DAILY WITH MEALS (Patient taking differently: INJECT  UNITS INTO THE SKIN 3 (THREE) TIMES DAILY WITH MEALS on sliding scale) 15 pen 3  . pravastatin (PRAVACHOL) 20 MG tablet Take 20 mg by mouth daily.     No current facility-administered medications on file prior to visit.     Allergies  Allergen Reactions  . Aspirin Swelling    Reaction: Facial swelling  . Ibuprofen Swelling    Reaction: Facial swelling   Social History   Socioeconomic History  . Marital status: Legally  Separated    Spouse name: Not on file  . Number of children: Not on file  . Years of education: Not on file  . Highest education level: Not on file  Occupational History  . Not on file  Tobacco Use  . Smoking status: Never  . Smokeless tobacco: Never  Substance and Sexual Activity  . Alcohol use: Yes    Alcohol/week: 2.0 standard drinks of alcohol    Types: 2 Cans of beer per week  . Drug use: No  . Sexual activity: Yes  Other Topics Concern  . Not on file  Social History Narrative  . Not on file   Social Determinants of Health   Financial Resource Strain: Not on file  Food Insecurity: Not on file  Transportation Needs: Not on file  Physical Activity: Not on file  Stress: Not on file  Social Connections: Not on file  Intimate Partner Violence: Not on file      Review of Systems  All other systems reviewed and are negative.      Objective:   Physical Exam Constitutional:      General: He is not in acute distress.    Appearance: Normal appearance. He is well-developed and normal weight. He is not ill-appearing or  toxic-appearing.  HENT:     Head: Normocephalic and atraumatic.     Right Ear: Tympanic membrane and ear canal normal.     Left Ear: Tympanic membrane and ear canal normal.     Mouth/Throat:     Mouth: Mucous membranes are moist.     Pharynx: Oropharynx is clear. No oropharyngeal exudate or posterior oropharyngeal erythema.  Eyes:     General: No scleral icterus.       Right eye: No discharge.        Left eye: No discharge.     Extraocular Movements: Extraocular movements intact.     Conjunctiva/sclera: Conjunctivae normal.     Pupils: Pupils are equal, round, and reactive to light.  Neck:     Vascular: No carotid bruit.  Cardiovascular:     Rate and Rhythm: Normal rate and regular rhythm.     Pulses: Normal pulses.     Heart sounds: Normal heart sounds. No murmur heard.    No friction rub. No gallop.  Pulmonary:     Effort: Pulmonary effort is  normal. No respiratory distress.     Breath sounds: Normal breath sounds. No stridor. No wheezing, rhonchi or rales.  Chest:     Chest wall: No tenderness.  Abdominal:     General: Bowel sounds are normal. There is no distension.     Palpations: Abdomen is soft. There is no mass.     Tenderness: There is no abdominal tenderness. There is no guarding or rebound.     Hernia: No hernia is present.  Musculoskeletal:        General: No deformity. Normal range of motion.     Cervical back: Normal range of motion and neck supple. No rigidity or tenderness.     Right lower leg: No edema.     Left lower leg: No edema.  Lymphadenopathy:     Cervical: No cervical adenopathy.  Skin:    General: Skin is warm.     Coloration: Skin is not jaundiced or pale.     Findings: No bruising, erythema, lesion or rash.  Neurological:     General: No focal deficit present.     Mental Status: He is alert and oriented to person, place, and time. Mental status is at baseline.     Cranial Nerves: No cranial nerve deficit.     Sensory: No sensory deficit.     Motor: No weakness.     Coordination: Coordination normal.     Gait: Gait normal.     Deep Tendon Reflexes: Reflexes normal.  Psychiatric:        Mood and Affect: Mood normal.        Behavior: Behavior normal.        Thought Content: Thought content normal.        Judgment: Judgment normal.          Assessment & Plan:  No diagnosis found. I believe the patient likely has laryngitis and a sore throat due to a virus.  I recommended tincture of time.  However I believe he may be dealing with sinusitis as given the intermittent pressure in his maxillary sinuses and pain that has been going on now for months.  We will try the patient on Augmentin 875 mg twice daily for 10 days and then reassess

## 2022-01-10 DIAGNOSIS — S233XXA Sprain of ligaments of thoracic spine, initial encounter: Secondary | ICD-10-CM | POA: Diagnosis not present

## 2022-01-10 DIAGNOSIS — S134XXA Sprain of ligaments of cervical spine, initial encounter: Secondary | ICD-10-CM | POA: Diagnosis not present

## 2022-01-10 DIAGNOSIS — S335XXA Sprain of ligaments of lumbar spine, initial encounter: Secondary | ICD-10-CM | POA: Diagnosis not present

## 2022-01-15 DIAGNOSIS — S335XXA Sprain of ligaments of lumbar spine, initial encounter: Secondary | ICD-10-CM | POA: Diagnosis not present

## 2022-01-15 DIAGNOSIS — S134XXA Sprain of ligaments of cervical spine, initial encounter: Secondary | ICD-10-CM | POA: Diagnosis not present

## 2022-01-15 DIAGNOSIS — S233XXA Sprain of ligaments of thoracic spine, initial encounter: Secondary | ICD-10-CM | POA: Diagnosis not present

## 2022-01-18 ENCOUNTER — Other Ambulatory Visit: Payer: Self-pay | Admitting: Family Medicine

## 2022-01-18 MED ORDER — OSELTAMIVIR PHOSPHATE 75 MG PO CAPS: 75.0000 mg | ORAL_CAPSULE | Freq: Every day | ORAL | 0 refills | Status: DC

## 2022-01-24 DIAGNOSIS — S134XXA Sprain of ligaments of cervical spine, initial encounter: Secondary | ICD-10-CM | POA: Diagnosis not present

## 2022-01-24 DIAGNOSIS — S335XXA Sprain of ligaments of lumbar spine, initial encounter: Secondary | ICD-10-CM | POA: Diagnosis not present

## 2022-01-24 DIAGNOSIS — S233XXA Sprain of ligaments of thoracic spine, initial encounter: Secondary | ICD-10-CM | POA: Diagnosis not present

## 2022-02-06 DIAGNOSIS — R7989 Other specified abnormal findings of blood chemistry: Secondary | ICD-10-CM | POA: Diagnosis not present

## 2022-02-06 DIAGNOSIS — Z7984 Long term (current) use of oral hypoglycemic drugs: Secondary | ICD-10-CM | POA: Diagnosis not present

## 2022-02-06 DIAGNOSIS — Z794 Long term (current) use of insulin: Secondary | ICD-10-CM | POA: Diagnosis not present

## 2022-02-06 DIAGNOSIS — E1365 Other specified diabetes mellitus with hyperglycemia: Secondary | ICD-10-CM | POA: Diagnosis not present

## 2022-02-06 DIAGNOSIS — E782 Mixed hyperlipidemia: Secondary | ICD-10-CM | POA: Diagnosis not present

## 2022-02-07 DIAGNOSIS — S233XXA Sprain of ligaments of thoracic spine, initial encounter: Secondary | ICD-10-CM | POA: Diagnosis not present

## 2022-02-07 DIAGNOSIS — S335XXA Sprain of ligaments of lumbar spine, initial encounter: Secondary | ICD-10-CM | POA: Diagnosis not present

## 2022-02-07 DIAGNOSIS — S134XXA Sprain of ligaments of cervical spine, initial encounter: Secondary | ICD-10-CM | POA: Diagnosis not present

## 2022-02-26 DIAGNOSIS — S233XXA Sprain of ligaments of thoracic spine, initial encounter: Secondary | ICD-10-CM | POA: Diagnosis not present

## 2022-02-26 DIAGNOSIS — S335XXA Sprain of ligaments of lumbar spine, initial encounter: Secondary | ICD-10-CM | POA: Diagnosis not present

## 2022-02-26 DIAGNOSIS — S134XXA Sprain of ligaments of cervical spine, initial encounter: Secondary | ICD-10-CM | POA: Diagnosis not present

## 2022-03-07 DIAGNOSIS — S233XXA Sprain of ligaments of thoracic spine, initial encounter: Secondary | ICD-10-CM | POA: Diagnosis not present

## 2022-03-07 DIAGNOSIS — S335XXA Sprain of ligaments of lumbar spine, initial encounter: Secondary | ICD-10-CM | POA: Diagnosis not present

## 2022-03-07 DIAGNOSIS — S134XXA Sprain of ligaments of cervical spine, initial encounter: Secondary | ICD-10-CM | POA: Diagnosis not present

## 2022-03-12 DIAGNOSIS — S233XXA Sprain of ligaments of thoracic spine, initial encounter: Secondary | ICD-10-CM | POA: Diagnosis not present

## 2022-03-12 DIAGNOSIS — S134XXA Sprain of ligaments of cervical spine, initial encounter: Secondary | ICD-10-CM | POA: Diagnosis not present

## 2022-03-12 DIAGNOSIS — S335XXA Sprain of ligaments of lumbar spine, initial encounter: Secondary | ICD-10-CM | POA: Diagnosis not present

## 2022-03-22 DIAGNOSIS — S233XXA Sprain of ligaments of thoracic spine, initial encounter: Secondary | ICD-10-CM | POA: Diagnosis not present

## 2022-03-22 DIAGNOSIS — S134XXA Sprain of ligaments of cervical spine, initial encounter: Secondary | ICD-10-CM | POA: Diagnosis not present

## 2022-03-22 DIAGNOSIS — S335XXA Sprain of ligaments of lumbar spine, initial encounter: Secondary | ICD-10-CM | POA: Diagnosis not present

## 2022-04-05 DIAGNOSIS — S134XXA Sprain of ligaments of cervical spine, initial encounter: Secondary | ICD-10-CM | POA: Diagnosis not present

## 2022-04-05 DIAGNOSIS — S335XXA Sprain of ligaments of lumbar spine, initial encounter: Secondary | ICD-10-CM | POA: Diagnosis not present

## 2022-04-05 DIAGNOSIS — S233XXA Sprain of ligaments of thoracic spine, initial encounter: Secondary | ICD-10-CM | POA: Diagnosis not present

## 2022-04-19 DIAGNOSIS — S134XXA Sprain of ligaments of cervical spine, initial encounter: Secondary | ICD-10-CM | POA: Diagnosis not present

## 2022-04-19 DIAGNOSIS — S335XXA Sprain of ligaments of lumbar spine, initial encounter: Secondary | ICD-10-CM | POA: Diagnosis not present

## 2022-04-19 DIAGNOSIS — S233XXA Sprain of ligaments of thoracic spine, initial encounter: Secondary | ICD-10-CM | POA: Diagnosis not present

## 2022-05-03 DIAGNOSIS — S134XXA Sprain of ligaments of cervical spine, initial encounter: Secondary | ICD-10-CM | POA: Diagnosis not present

## 2022-05-03 DIAGNOSIS — S335XXA Sprain of ligaments of lumbar spine, initial encounter: Secondary | ICD-10-CM | POA: Diagnosis not present

## 2022-05-03 DIAGNOSIS — S233XXA Sprain of ligaments of thoracic spine, initial encounter: Secondary | ICD-10-CM | POA: Diagnosis not present

## 2022-05-06 ENCOUNTER — Ambulatory Visit: Payer: BC Managed Care – PPO | Admitting: Family Medicine

## 2022-05-06 VITALS — BP 118/78 | HR 85 | Temp 97.8°F | Ht 67.0 in | Wt 195.0 lb

## 2022-05-06 DIAGNOSIS — W57XXXA Bitten or stung by nonvenomous insect and other nonvenomous arthropods, initial encounter: Secondary | ICD-10-CM | POA: Diagnosis not present

## 2022-05-06 DIAGNOSIS — S70362A Insect bite (nonvenomous), left thigh, initial encounter: Secondary | ICD-10-CM | POA: Diagnosis not present

## 2022-05-06 MED ORDER — TERBINAFINE HCL 250 MG PO TABS: 250.0000 mg | ORAL_TABLET | Freq: Every day | ORAL | 2 refills | Status: DC

## 2022-05-06 MED ORDER — TRIAMCINOLONE ACETONIDE 0.1 % EX CREA: 1.0000 | TOPICAL_CREAM | Freq: Two times a day (BID) | CUTANEOUS | 0 refills | Status: AC

## 2022-05-06 MED ORDER — DOXYCYCLINE HYCLATE 100 MG PO TABS: 100.0000 mg | ORAL_TABLET | Freq: Two times a day (BID) | ORAL | 0 refills | Status: DC

## 2022-05-06 NOTE — Progress Notes (Signed)
Subjective:    Patient ID: Clifford Whitney, male    DOB: [birth year], 37 y.o.   MRN: [id number]   Patient was concerned because he found a tick on him recently.  He was able to remove the tick however he is concerned about the reaction that is shown above.  This is located on his upper medial left thigh.  There is no spreading redness draining.  There is no warmth at the site.  It does itch.  He denies any fevers or chills.  He denies any joint pain. Past Medical History:  Diagnosis Date  . ADD (attention deficit disorder)    "bad!!!!!" (08/10/2013)  . Family history of anesthesia complication    "mom woke up on operating table"  . Migraine    "a few times/yr" (08/10/2013)  . Type 1 diabetes mellitus    "actually type 1.5"   Past Surgical History:  Procedure Laterality Date  . NO PAST SURGERIES     Current Outpatient Medications on File Prior to Visit  Medication Sig Dispense Refill  . B-D INS SYR ULTRAFINE .3CC/30G 30G X 1/2" 0.3 ML MISC USE ONCE A DAY. 100 each 5  . Dapagliflozin Pro-metFORMIN ER (XIGDUO XR) 05-998 MG TB24 Take 5 each by mouth.    . insulin glargine (LANTUS) 100 UNIT/ML injection Inject 35 Units into the skin daily.    . Insulin Pen Needle (B-D UF III MINI PEN NEEDLES) 31G X 5 MM MISC TEST 3 TIMES A DAY 100 each 1  . lisinopril (PRINIVIL,ZESTRIL) 10 MG tablet TAKE 1 TABLET BY MOUTH EVERY DAY 30 tablet 0  . NOVOLOG FLEXPEN 100 UNIT/ML FlexPen INJECT 5 UNITS INTO THE SKIN 3 (THREE) TIMES DAILY WITH MEALS (Patient taking differently: INJECT  UNITS INTO THE SKIN 3 (THREE) TIMES DAILY WITH MEALS on sliding scale) 15 pen 3  . oseltamivir (TAMIFLU) 75 MG capsule Take 1 capsule (75 mg total) by mouth daily. Dx: Z20.828- exposure to influenza 7 capsule 0  . pravastatin (PRAVACHOL) 20 MG tablet Take 20 mg by mouth daily.    Marland Kitchen terbinafine (LAMISIL) 250 MG tablet Take 1 tablet (250 mg total) by mouth daily. 30 tablet 2   No current facility-administered medications on file prior  to visit.   Allergies  Allergen Reactions  . Aspirin Swelling    Reaction: Facial swelling  . Ibuprofen Swelling    Reaction: Facial swelling   Social History   Socioeconomic History  . Marital status: Married    Spouse name: Not on file  . Number of children: Not on file  . Years of education: Not on file  . Highest education level: 12th grade  Occupational History  . Not on file  Tobacco Use  . Smoking status: Never  . Smokeless tobacco: Never  Substance and Sexual Activity  . Alcohol use: Yes    Alcohol/week: 2.0 standard drinks of alcohol    Types: 2 Cans of beer per week  . Drug use: No  . Sexual activity: Yes  Other Topics Concern  . Not on file  Social History Narrative  . Not on file   Social Determinants of Health   Financial Resource Strain: Low Risk  (05/06/2022)   Overall Financial Resource Strain (CARDIA)   . Difficulty of Paying Living Expenses: Not hard at all  Food Insecurity: No Food Insecurity (05/06/2022)   Hunger Vital Sign   . Worried About Programme researcher, broadcasting/film/video in the Last Year: Never true   . Ran  Out of Food in the Last Year: Never true  Transportation Needs: No Transportation Needs (05/06/2022)   PRAPARE - Transportation   . Lack of Transportation (Medical): No   . Lack of Transportation (Non-Medical): No  Physical Activity: Unknown (05/06/2022)   Exercise Vital Sign   . Days of Exercise per Week: 0 days   . Minutes of Exercise per Session: Not on file  Stress: No Stress Concern Present (05/06/2022)   [employer] of Occupational Health - Occupational Stress Questionnaire   . Feeling of Stress : Not at all  Social Connections: Socially Integrated (05/06/2022)   Social Connection and Isolation Panel [NHANES]   . Frequency of Communication with Friends and Family: More than three times a week   . Frequency of Social Gatherings with Friends and Family: Twice a week   . Attends Religious Services: More than 4 times per year   . Active Member  of Clubs or Organizations: Yes   . Attends Banker Meetings: More than 4 times per year   . Marital Status: Married  Catering manager Violence: Not on file      Review of Systems  All other systems reviewed and are negative.      Objective:   Physical Exam Constitutional:      Appearance: He is well-developed.  Cardiovascular:     Rate and Rhythm: Normal rate and regular rhythm.     Heart sounds: Normal heart sounds. No murmur heard. Pulmonary:     Effort: Pulmonary effort is normal. No respiratory distress.     Breath sounds: Normal breath sounds. No wheezing or rales.  Musculoskeletal:     Cervical back: Neck supple.       Legs:  Lymphadenopathy:     Cervical: No cervical adenopathy.  Neurological:     Mental Status: He is alert.          Assessment & Plan:  Tick bite of left thigh, initial encounter I see no evidence of erythema migrans, cellulitis, and he has no symptoms of [facility] spotted fever.  Therefore recommended putting triamcinolone cream twice daily on the local allergic reaction to compound the erythema and itching.  If he develops erythema migrans, I described what the rash would look like and when to start doxycycline however I see no indication at the present time

## 2022-05-16 DIAGNOSIS — S233XXA Sprain of ligaments of thoracic spine, initial encounter: Secondary | ICD-10-CM | POA: Diagnosis not present

## 2022-05-16 DIAGNOSIS — S335XXA Sprain of ligaments of lumbar spine, initial encounter: Secondary | ICD-10-CM | POA: Diagnosis not present

## 2022-05-16 DIAGNOSIS — S134XXA Sprain of ligaments of cervical spine, initial encounter: Secondary | ICD-10-CM | POA: Diagnosis not present

## 2022-05-30 DIAGNOSIS — S335XXA Sprain of ligaments of lumbar spine, initial encounter: Secondary | ICD-10-CM | POA: Diagnosis not present

## 2022-05-30 DIAGNOSIS — S233XXA Sprain of ligaments of thoracic spine, initial encounter: Secondary | ICD-10-CM | POA: Diagnosis not present

## 2022-05-30 DIAGNOSIS — S134XXA Sprain of ligaments of cervical spine, initial encounter: Secondary | ICD-10-CM | POA: Diagnosis not present

## 2022-06-13 DIAGNOSIS — R946 Abnormal results of thyroid function studies: Secondary | ICD-10-CM | POA: Diagnosis not present

## 2022-06-13 DIAGNOSIS — E139 Other specified diabetes mellitus without complications: Secondary | ICD-10-CM | POA: Diagnosis not present

## 2022-06-13 DIAGNOSIS — Z794 Long term (current) use of insulin: Secondary | ICD-10-CM | POA: Diagnosis not present

## 2022-06-13 DIAGNOSIS — Z7984 Long term (current) use of oral hypoglycemic drugs: Secondary | ICD-10-CM | POA: Diagnosis not present

## 2022-08-06 ENCOUNTER — Ambulatory Visit: Payer: BC Managed Care – PPO | Admitting: Family Medicine

## 2022-10-21 DIAGNOSIS — R7989 Other specified abnormal findings of blood chemistry: Secondary | ICD-10-CM | POA: Diagnosis not present

## 2022-10-21 DIAGNOSIS — E139 Other specified diabetes mellitus without complications: Secondary | ICD-10-CM | POA: Diagnosis not present

## 2022-10-21 DIAGNOSIS — Z794 Long term (current) use of insulin: Secondary | ICD-10-CM | POA: Insufficient documentation

## 2022-12-03 ENCOUNTER — Encounter: Payer: BC Managed Care – PPO | Admitting: Family Medicine

## 2022-12-05 ENCOUNTER — Encounter: Payer: BC Managed Care – PPO | Admitting: Family Medicine

## 2022-12-23 ENCOUNTER — Ambulatory Visit (INDEPENDENT_AMBULATORY_CARE_PROVIDER_SITE_OTHER): Payer: BC Managed Care – PPO | Admitting: Family Medicine

## 2022-12-23 ENCOUNTER — Encounter: Payer: Self-pay | Admitting: Family Medicine

## 2022-12-23 VITALS — BP 132/78 | HR 83 | Temp 98.2°F | Ht 67.0 in | Wt 207.4 lb

## 2022-12-23 DIAGNOSIS — E1165 Type 2 diabetes mellitus with hyperglycemia: Secondary | ICD-10-CM

## 2022-12-23 DIAGNOSIS — Z794 Long term (current) use of insulin: Secondary | ICD-10-CM

## 2022-12-23 DIAGNOSIS — Z0000 Encounter for general adult medical examination without abnormal findings: Secondary | ICD-10-CM

## 2022-12-23 DIAGNOSIS — Z0001 Encounter for general adult medical examination with abnormal findings: Secondary | ICD-10-CM

## 2022-12-23 NOTE — Progress Notes (Signed)
Subjective:    Patient ID: Clifford Whitney, male    DOB: [birth year], 37 y.o.   MRN: [id number]  Patient is a very pleasant 37 year old Caucasian gentleman who presents today for a physical exam.  He recently saw his endocrinologist.  Endocrinology did not hemoglobin A1c that was 7.8.  For this patient that is much better.  He is using continuous blood glucose monitoring.  He denies any hypoglycemic episodes.  He recently saw his ophthalmologist ophthalmology saw no evidence of diabetic retinopathy.  Diabetic foot exam was performed today and is normal.  He is due for a flu shot, he is due for a COVID shot.  He is also due for a booster on his pneumonia vaccine.  We discussed these today and he declines them.  His blood pressure today is well-controlled.  Diabetic foot exam was performed today and was normal.  Past Medical History:  Diagnosis Date   ADD (attention deficit disorder)    "bad!!!!!" (08/10/2013)   Family history of anesthesia complication    "mom woke up on operating table"   Migraine    "a few times/yr" (08/10/2013)   Type 1 diabetes mellitus ([facility])    "actually type 1.5"   Past Surgical History:  Procedure Laterality Date   NO PAST SURGERIES     Current Outpatient Medications on File Prior to Visit  Medication Sig Dispense Refill   B-D INS SYR ULTRAFINE .3CC/30G 30G X 1/2" 0.3 ML MISC USE ONCE A DAY. 100 each 5   Dapagliflozin Pro-metFORMIN ER (XIGDUO XR) 05-998 MG TB24 Take 5 each by mouth.     doxycycline (VIBRA-TABS) 100 MG tablet Take 1 tablet (100 mg total) by mouth 2 (two) times daily. 42 tablet 0   insulin glargine (LANTUS) 100 UNIT/ML injection Inject 35 Units into the skin daily.     Insulin Pen Needle (B-D UF III MINI PEN NEEDLES) 31G X 5 MM MISC TEST 3 TIMES A DAY 100 each 1   lisinopril (PRINIVIL,ZESTRIL) 10 MG tablet TAKE 1 TABLET BY MOUTH EVERY DAY 30 tablet 0   NOVOLOG FLEXPEN 100 UNIT/ML FlexPen INJECT 5 UNITS INTO THE SKIN 3 (THREE) TIMES DAILY WITH MEALS  (Patient taking differently: INJECT  UNITS INTO THE SKIN 3 (THREE) TIMES DAILY WITH MEALS on sliding scale) 15 pen 3   oseltamivir (TAMIFLU) 75 MG capsule Take 1 capsule (75 mg total) by mouth daily. Dx: Z20.828- exposure to influenza 7 capsule 0   pravastatin (PRAVACHOL) 20 MG tablet Take 20 mg by mouth daily.     terbinafine (LAMISIL) 250 MG tablet Take 1 tablet (250 mg total) by mouth daily. 30 tablet 2   terbinafine (LAMISIL) 250 MG tablet Take 1 tablet (250 mg total) by mouth daily. 30 tablet 2   triamcinolone cream (KENALOG) 0.1 % Apply 1 Application topically 2 (two) times daily. 30 g 0   No current facility-administered medications on file prior to visit.       Allergies  Allergen Reactions   Aspirin Swelling    Reaction: Facial swelling   Ibuprofen Swelling    Reaction: Facial swelling   Social History   Socioeconomic History   Marital status: Married    Spouse name: Not on file   Number of children: Not on file   Years of education: Not on file   Highest education level: 12th grade  Occupational History   Not on file  Tobacco Use   Smoking status: Never   Smokeless tobacco: Never  Substance  and Sexual Activity   Alcohol use: Yes    Alcohol/week: 2.0 standard drinks of alcohol    Types: 2 Cans of beer per week   Drug use: No   Sexual activity: Yes  Other Topics Concern   Not on file  Social History Narrative   Not on file   Social Determinants of Health   Financial Resource Strain: Low Risk  (12/19/2022)   Overall Financial Resource Strain (CARDIA)    Difficulty of Paying Living Expenses: Not hard at all  Food Insecurity: No Food Insecurity (12/19/2022)   Hunger Vital Sign    Worried About Running Out of Food in the Last Year: Never true    Ran Out of Food in the Last Year: Never true  Transportation Needs: No Transportation Needs (12/19/2022)   PRAPARE - Administrator, Civil Service (Medical): No    Lack of Transportation (Non-Medical): No   Physical Activity: Unknown (12/19/2022)   Exercise Vital Sign    Days of Exercise per Week: 0 days    Minutes of Exercise per Session: Not on file  Stress: Stress Concern Present (12/19/2022)   [employer] of Occupational Health - Occupational Stress Questionnaire    Feeling of Stress : To some extent  Social Connections: Socially Integrated (12/19/2022)   Social Connection and Isolation Panel [NHANES]    Frequency of Communication with Friends and Family: More than three times a week    Frequency of Social Gatherings with Friends and Family: More than three times a week    Attends Religious Services: More than 4 times per year    Active Member of [employer] or Organizations: Yes    Attends Engineer, structural: More than 4 times per year    Marital Status: Married  Catering manager Violence: Not on file      Review of Systems  All other systems reviewed and are negative.      Objective:   Physical Exam Constitutional:      General: He is not in acute distress.    Appearance: Normal appearance. He is well-developed and normal weight. He is not ill-appearing or toxic-appearing.  HENT:     Head: Normocephalic and atraumatic.     Right Ear: Tympanic membrane and ear canal normal.     Left Ear: Tympanic membrane and ear canal normal.     Mouth/Throat:     Mouth: Mucous membranes are moist.     Pharynx: Oropharynx is clear. No oropharyngeal exudate or posterior oropharyngeal erythema.  Eyes:     General: No scleral icterus.       Right eye: No discharge.        Left eye: No discharge.     Extraocular Movements: Extraocular movements intact.     Conjunctiva/sclera: Conjunctivae normal.     Pupils: Pupils are equal, round, and reactive to light.  Neck:     Vascular: No carotid bruit.  Cardiovascular:     Rate and Rhythm: Normal rate and regular rhythm.     Pulses: Normal pulses.     Heart sounds: Normal heart sounds. No murmur heard.    No friction rub. No gallop.   Pulmonary:     Effort: Pulmonary effort is normal. No respiratory distress.     Breath sounds: Normal breath sounds. No stridor. No wheezing, rhonchi or rales.  Chest:     Chest wall: No tenderness.  Abdominal:     General: Bowel sounds are normal. There is no distension.  Palpations: Abdomen is soft. There is no mass.     Tenderness: There is no abdominal tenderness. There is no guarding or rebound.     Hernia: No hernia is present.  Musculoskeletal:        General: No deformity. Normal range of motion.     Cervical back: Normal range of motion and neck supple. No rigidity or tenderness.     Right lower leg: No edema.     Left lower leg: No edema.  Lymphadenopathy:     Cervical: No cervical adenopathy.  Skin:    General: Skin is warm.     Coloration: Skin is not jaundiced or pale.     Findings: No bruising, erythema, lesion or rash.  Neurological:     General: No focal deficit present.     Mental Status: He is alert and oriented to person, place, and time. Mental status is at baseline.     Cranial Nerves: No cranial nerve deficit.     Sensory: No sensory deficit.     Motor: No weakness.     Coordination: Coordination normal.     Gait: Gait normal.     Deep Tendon Reflexes: Reflexes normal.  Psychiatric:        Mood and Affect: Mood normal.        Behavior: Behavior normal.        Thought Content: Thought content normal.        Judgment: Judgment normal.           Assessment & Plan:  Type 2 diabetes mellitus with hyperglycemia, with long-term current use of insulin (HCC) - Plan: CBC with Differential/Platelet, COMPLETE METABOLIC PANEL WITH GFR, Lipid panel, TSH, Microalbumin/Creatinine Ratio, Urine, Hemoglobin A1c  General medical exam  Diabetic foot exam was performed today and is normal.  Blood pressure is outstanding.  Recommended a flu shot, COVID shot, and Prevnar 20.  Patient declines vaccinations today.  He is not due for any cancer screening.  I will check  a TSH and if greater than 10 will discuss levothyroxine.  Check a hemoglobin A1c, urine microalbumin to creatinine ratio, a lipid panel, CBC and a CMP.  Diabetic eye exam and foot exam are up-to-date.

## 2022-12-25 LAB — TSH: TSH: 3.73 m[IU]/L (ref 0.40–4.50)

## 2022-12-25 LAB — LIPID PANEL
Cholesterol: 163 mg/dL (ref <200)
HDL: 39 mg/dL — ABNORMAL LOW (ref >=40)
LDL Cholesterol (Calc): 96 mg/dL
Non-HDL Cholesterol (Calc): 124 mg/dL (ref <130)
Total CHOL/HDL Ratio: 4.2 (calc) (ref <5.0)
Triglycerides: 180 mg/dL — ABNORMAL HIGH (ref <150)

## 2022-12-25 LAB — CBC WITH DIFFERENTIAL/PLATELET
Absolute Lymphocytes: 2162 {cells}/uL (ref 850–3900)
Absolute Monocytes: 721 {cells}/uL (ref 200–950)
Basophils Absolute: 64 {cells}/uL (ref 0–200)
Basophils Relative: 0.6 %
Eosinophils Absolute: 127 {cells}/uL (ref 15–500)
Eosinophils Relative: 1.2 %
HCT: 46.9 % (ref 38.5–50.0)
Hemoglobin: 15.7 g/dL (ref 13.2–17.1)
MCH: 29.3 pg (ref 27.0–33.0)
MCHC: 33.5 g/dL (ref 32.0–36.0)
MCV: 87.7 fL (ref 80.0–100.0)
MPV: 10.9 fL (ref 7.5–12.5)
Monocytes Relative: 6.8 %
Neutro Abs: 7526 {cells}/uL (ref 1500–7800)
Neutrophils Relative %: 71 %
Platelets: 412 10*3/uL — ABNORMAL HIGH (ref 140–400)
RBC: 5.35 10*6/uL (ref 4.20–5.80)
RDW: 11.9 % (ref 11.0–15.0)
Total Lymphocyte: 20.4 %
WBC: 10.6 10*3/uL (ref 3.8–10.8)

## 2022-12-25 LAB — MICROALBUMIN / CREATININE URINE RATIO
Creatinine, Urine: 122 mg/dL (ref 20–320)
Microalb Creat Ratio: 83 mg/g{creat} — ABNORMAL HIGH (ref <30)
Microalb, Ur: 10.1 mg/dL

## 2022-12-25 LAB — HEMOGLOBIN A1C
Hgb A1c MFr Bld: 7.8 %{Hb} — ABNORMAL HIGH (ref <5.7)
Mean Plasma Glucose: 177 mg/dL
eAG (mmol/L): 9.8 mmol/L

## 2022-12-25 LAB — COMPLETE METABOLIC PANEL WITH GFR
AG Ratio: 1.6 (calc) (ref 1.0–2.5)
ALT: 24 U/L (ref 9–46)
AST: 24 U/L (ref 10–40)
Albumin: 4.5 g/dL (ref 3.6–5.1)
Alkaline phosphatase (APISO): 63 U/L (ref 36–130)
BUN/Creatinine Ratio: 11 (calc) (ref 6–22)
BUN: 14 mg/dL (ref 7–25)
CO2: 28 mmol/L (ref 20–32)
Calcium: 9.8 mg/dL (ref 8.6–10.3)
Chloride: 98 mmol/L (ref 98–110)
Creat: 1.29 mg/dL — ABNORMAL HIGH (ref 0.60–1.26)
Globulin: 2.8 g/dL (ref 1.9–3.7)
Glucose, Bld: 132 mg/dL — ABNORMAL HIGH (ref 65–99)
Potassium: 4.5 mmol/L (ref 3.5–5.3)
Sodium: 138 mmol/L (ref 135–146)
Total Bilirubin: 0.4 mg/dL (ref 0.2–1.2)
Total Protein: 7.3 g/dL (ref 6.1–8.1)
eGFR: 74 mL/min/{1.73_m2} (ref >=60)

## 2023-02-26 ENCOUNTER — Emergency Department (HOSPITAL_BASED_OUTPATIENT_CLINIC_OR_DEPARTMENT_OTHER)
Admission: EM | Admit: 2023-02-26 | Discharge: 2023-02-26 | Disposition: A | Discharge status: Home / Self Care | Payer: BC Managed Care – PPO | Attending: Emergency Medicine | Admitting: Emergency Medicine

## 2023-02-26 ENCOUNTER — Encounter (HOSPITAL_BASED_OUTPATIENT_CLINIC_OR_DEPARTMENT_OTHER): Payer: Self-pay | Admitting: Emergency Medicine

## 2023-02-26 ENCOUNTER — Other Ambulatory Visit: Payer: Self-pay

## 2023-02-26 DIAGNOSIS — Z79899 Other long term (current) drug therapy: Secondary | ICD-10-CM | POA: Insufficient documentation

## 2023-02-26 DIAGNOSIS — L03039 Cellulitis of unspecified toe: Secondary | ICD-10-CM

## 2023-02-26 DIAGNOSIS — L03031 Cellulitis of right toe: Secondary | ICD-10-CM | POA: Insufficient documentation

## 2023-02-26 DIAGNOSIS — E109 Type 1 diabetes mellitus without complications: Secondary | ICD-10-CM | POA: Diagnosis not present

## 2023-02-26 DIAGNOSIS — M79674 Pain in right toe(s): Secondary | ICD-10-CM | POA: Diagnosis present

## 2023-02-26 DIAGNOSIS — Z794 Long term (current) use of insulin: Secondary | ICD-10-CM | POA: Diagnosis not present

## 2023-02-26 MED ORDER — AMOXICILLIN-POT CLAVULANATE 875-125 MG PO TABS: 1.0000 | ORAL_TABLET | Freq: Two times a day (BID) | ORAL | 0 refills | Status: AC

## 2023-02-26 NOTE — Discharge Instructions (Addendum)
Thank you for letting us evaluate you today.  I have sent an antibiotic to your pharmacy to combat infection.  Please make sure to follow-up with your podiatrist on Monday for further management and reassessment of toe.  Try not to cut nails short or pull at toenails.  Please make sure to keep area dry and clean.  Return to emergency department if you experience significant worsening of toe, spreading of redness.

## 2023-02-26 NOTE — ED Provider Notes (Signed)
[facility] Provider Note   CSN: [id number] Arrival date & time: 02/26/23  1758     History  Chief Complaint  Patient presents with   Toe Pain    Clifford Whitney is a 38 y.o. 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  He reports that he was picking at his toenail and attempted to cut nail back and may have cut it too short.  He denies fevers at home.  He has a podiatry appointment on Monday 03/03/23   Toe Pain Pertinent negatives include no chest pain, no abdominal pain, no headaches and no shortness of breath.       Home Medications Prior to Admission medications   Medication Sig Start Date End Date Taking? Authorizing Provider  B-D INS SYR ULTRAFINE .3CC/30G 30G X 1/2" 0.3 ML MISC USE ONCE A DAY. 11/19/12   Donita Brooks, MD  Dapagliflozin Pro-metFORMIN ER (XIGDUO XR) 05-998 MG TB24 Take 5 each by mouth.    [provider]  doxycycline (VIBRA-TABS) 100 MG tablet Take 1 tablet (100 mg total) by mouth 2 (two) times daily. 05/06/22   Donita Brooks, MD  insulin glargine (LANTUS) 100 UNIT/ML injection Inject 35 Units into the skin daily.    [provider]  Insulin Pen Needle (B-D UF III MINI PEN NEEDLES) 31G X 5 MM MISC TEST 3 TIMES A DAY 02/24/15   Donita Brooks, MD  lisinopril (PRINIVIL,ZESTRIL) 10 MG tablet TAKE 1 TABLET BY MOUTH EVERY DAY 02/19/16   Donita Brooks, MD  NOVOLOG FLEXPEN 100 UNIT/ML FlexPen INJECT 5 UNITS INTO THE SKIN 3 (THREE) TIMES DAILY WITH MEALS Patient taking differently: INJECT  UNITS INTO THE SKIN 3 (THREE) TIMES DAILY WITH MEALS on sliding scale 11/07/14   Donita Brooks, MD  oseltamivir (TAMIFLU) 75 MG capsule Take 1 capsule (75 mg total) by mouth daily. Dx: Z20.828- exposure to influenza 01/18/22   Donita Brooks, MD  pravastatin  (PRAVACHOL) 20 MG tablet Take 20 mg by mouth daily.    [provider]  terbinafine (LAMISIL) 250 MG tablet Take 1 tablet (250 mg total) by mouth daily. 11/29/21   Donita Brooks, MD  terbinafine (LAMISIL) 250 MG tablet Take 1 tablet (250 mg total) by mouth daily. 05/06/22   Donita Brooks, MD  triamcinolone cream (KENALOG) 0.1 % Apply 1 Application topically 2 (two) times daily. 05/06/22   Donita Brooks, MD      Allergies    Aspirin and Ibuprofen    Review of Systems   Review of Systems  Constitutional:  Negative for chills, fatigue and fever.  Respiratory:  Negative for cough, chest tightness, shortness of breath and wheezing.   Cardiovascular:  Negative for chest pain and palpitations.  Gastrointestinal:  Negative for abdominal pain, constipation, diarrhea, nausea and vomiting.  Neurological:  Negative for dizziness, seizures, weakness, light-headedness, numbness and headaches.    Physical Exam Updated Vital Signs BP (!) 133/99 (BP Location: Left Arm)   Pulse 88   Temp 98 F (36.7 C)   Resp 18   Wt 87.1 kg   SpO2 99%   BMI 30.07 kg/m  Physical Exam Vitals and nursing note reviewed.  Constitutional:      General: He is not in acute distress.    Appearance: Normal appearance.  HENT:  Head: Normocephalic and atraumatic.  Eyes:     Conjunctiva/sclera: Conjunctivae normal.  Cardiovascular:     Rate and Rhythm: Normal rate.     Pulses:          Dorsalis pedis pulses are 2+ on the right side and 2+ on the left side.  Pulmonary:     Effort: Pulmonary effort is normal. No respiratory distress.     Breath sounds: Normal breath sounds.  Musculoskeletal:     Right lower leg: No edema.     Left lower leg: No edema.     Comments: Able to fully flex and extend metatarsal joints.  No significant swelling to joints of right foot  Skin:    General: Skin is warm.     Capillary Refill: Capillary refill takes less than 2 seconds.     Coloration: Skin is not  jaundiced or pale.     Comments: Mild warmth, swelling, erythremia without fluctuance nor drainage to lateral nail fold Mild warmth and erythremia to proximal nail fold See media  Neurological:     Mental Status: He is alert and oriented to person, place, and time. Mental status is at baseline.     Gait: Gait normal.     ED Results / Procedures / Treatments   Labs (all labs ordered are listed, but only abnormal results are displayed) Labs Reviewed - No data to display  EKG None  Radiology No results found.  Procedures Procedures    Medications Ordered in ED Medications - No data to display  ED Course/ Medical Decision Making/ A&P                                 Medical Decision Making Risk Prescription drug management.   Patient presents to the ED for concern of right great toe infection, this involves an extensive number of treatment options, and is a complaint that carries with it a high risk of complications and morbidity.  The differential diagnosis includes paronychia, cellulitis, traumatic injury, abscess   Co morbidities that complicate the patient evaluation  Diabetes   Additional history obtained:  Additional history obtained from  Nursing   External records from outside source obtained and reviewed including triage RN note    Medicines ordered and prescription drug management:  I ordered medication including augmentin  for paronychia  I have reviewed the patients home medicines and have made adjustments as needed    Problem List / ED Course:  Paronychia No obvious source of fluctuance or abscess for drainage Likely due to him picking at his toenail.  Will provide antibiotics and have him follow-up with podiatry on Monday as scheduled I have low suspicion for cellulitis, osteomyelitis, traumatic injury to nail or bone underneath.  Infection seems to be around nail fold consistent with paronychia.  I have low suspicion for septic joint, gout  as there is no significant swelling to joint.  He is able to fully range metatarsal joints    Reevaluation:  After the interventions noted above, I reevaluated the patient and found that they have :stayed the same   Social Determinants of Health:  Has podiatry follow-up on Monday   Dispostion:  After consideration of the diagnostic results and the patients response to treatment, I feel that the patent would benefit from patient management with antibiotics and podiatry follow-up.    Final Clinical Impression(s) / ED Diagnoses Final diagnoses:  Paronychia of great toe  Rx / DC Orders ED Discharge Orders     None         Judithann Sheen, PA 02/26/23 2015    Virgina Norfolk, DO 02/26/23 2154

## 2023-02-26 NOTE — ED Triage Notes (Signed)
Right toe possible infection , swelling and redness. X 1 week

## 2023-12-25 ENCOUNTER — Ambulatory Visit: Payer: BC Managed Care – PPO | Admitting: Family Medicine

## 2023-12-25 ENCOUNTER — Encounter: Payer: Self-pay | Admitting: Family Medicine

## 2023-12-25 VITALS — BP 108/70 | HR 105 | Temp 98.2°F | Ht 67.0 in | Wt 196.0 lb

## 2023-12-25 DIAGNOSIS — F900 Attention-deficit hyperactivity disorder, predominantly inattentive type: Secondary | ICD-10-CM

## 2023-12-25 DIAGNOSIS — Z0000 Encounter for general adult medical examination without abnormal findings: Secondary | ICD-10-CM

## 2023-12-25 DIAGNOSIS — E1165 Type 2 diabetes mellitus with hyperglycemia: Secondary | ICD-10-CM

## 2023-12-25 MED ORDER — LISDEXAMFETAMINE DIMESYLATE 30 MG PO CAPS: 30.0000 mg | ORAL_CAPSULE | Freq: Every day | ORAL | 0 refills | Status: DC

## 2023-12-25 NOTE — Progress Notes (Signed)
 Subjective:    Patient ID: Clifford Whitney, male    DOB: [birth year], 38 y.o.   MRN: [id number]  Patient is a very pleasant 38 year old Caucasian gentleman who presents today for a physical exam.  He is currently seeing endocrinology.  His last A1c was reportedly at 7.  He is due to check a fasting lipid panel, a microalbumin creatinine ratio, a CMP and a CBC.  He declines a flu shot.  Patient was diagnosed with ADD as a child.  He took medication throughout school.  He has been off medication for quite some time.  However now he is having a more difficult time at work.  He states that he will start a task, he will become distracted, he will move to another task.  As result, he has a difficult time finishing his assignments.  He is having to frequently go back and repeat things because he has gotten distracted.  He is also finds himself more easily frustrated at the end of the day because he feels tired and exhausted from his work.  He finds himself easily distracted.  He has a difficult time maintaining focus.  He works 3 to 4 days a week and he would like to take a medication during his days at work to help him focus so that he can focus on 1 task at a time and take it to completion rather than become easily distracted.  He denies any chest pain or shortness of breath  Past Medical History:  Diagnosis Date   ADD (attention deficit disorder)    bad!!!!! (08/10/2013)   Family history of anesthesia complication    mom woke up on operating table   Migraine    a few times/yr (08/10/2013)   Type 1 diabetes mellitus (HCC)    actually type 1.5   Past Surgical History:  Procedure Laterality Date   NO PAST SURGERIES     Current Outpatient Medications on File Prior to Visit  Medication Sig Dispense Refill   B-D INS SYR ULTRAFINE .3CC/30G 30G X 1/2 0.3 ML MISC USE ONCE A DAY. 100 each 5   Dapagliflozin Pro-metFORMIN  ER (XIGDUO XR) 05-998 MG TB24 Take 5 each by mouth.     doxycycline   (VIBRA -TABS) 100 MG tablet Take 1 tablet (100 mg total) by mouth 2 (two) times daily. 42 tablet 0   insulin  glargine (LANTUS ) 100 UNIT/ML injection Inject 35 Units into the skin daily.     Insulin  Pen Needle (B-D UF III MINI PEN NEEDLES) 31G X 5 MM MISC TEST 3 TIMES A DAY 100 each 1   lisinopril  (PRINIVIL ,ZESTRIL ) 10 MG tablet TAKE 1 TABLET BY MOUTH EVERY DAY 30 tablet 0   NOVOLOG  FLEXPEN 100 UNIT/ML FlexPen INJECT 5 UNITS INTO THE SKIN 3 (THREE) TIMES DAILY WITH MEALS (Patient taking differently: INJECT  UNITS INTO THE SKIN 3 (THREE) TIMES DAILY WITH MEALS on sliding scale) 15 pen 3   oseltamivir  (TAMIFLU ) 75 MG capsule Take 1 capsule (75 mg total) by mouth daily. Dx: Z20.828- exposure to influenza 7 capsule 0   pravastatin  (PRAVACHOL ) 20 MG tablet Take 20 mg by mouth daily.     terbinafine  (LAMISIL ) 250 MG tablet Take 1 tablet (250 mg total) by mouth daily. 30 tablet 2   terbinafine  (LAMISIL ) 250 MG tablet Take 1 tablet (250 mg total) by mouth daily. 30 tablet 2   triamcinolone  cream (KENALOG ) 0.1 % Apply 1 Application topically 2 (two) times daily. 30 g 0   No  current facility-administered medications on file prior to visit.       Allergies  Allergen Reactions   Aspirin Swelling    Reaction: Facial swelling   Ibuprofen Swelling    Reaction: Facial swelling   Social History   Socioeconomic History   Marital status: Married    Spouse name: Not on file   Number of children: Not on file   Years of education: Not on file   Highest education level: 12th grade  Occupational History   Not on file  Tobacco Use   Smoking status: Never   Smokeless tobacco: Never  Substance and Sexual Activity   Alcohol use: Yes    Alcohol/week: 2.0 standard drinks of alcohol    Types: 2 Cans of beer per week   Drug use: No   Sexual activity: Yes  Other Topics Concern   Not on file  Social History Narrative   Not on file   Social Drivers of Health   Tobacco Use: Low Risk (12/25/2023)    Patient History    Smoking Tobacco Use: Never    Smokeless Tobacco Use: Never    Passive Exposure: Not on file  Financial Resource Strain: Low Risk (12/19/2022)   Overall Financial Resource Strain (CARDIA)    Difficulty of Paying Living Expenses: Not hard at all  Food Insecurity: No Food Insecurity (12/19/2022)   Hunger Vital Sign    Worried About Running Out of Food in the Last Year: Never true    Ran Out of Food in the Last Year: Never true  Transportation Needs: No Transportation Needs (12/19/2022)   PRAPARE - Administrator, Civil Service (Medical): No    Lack of Transportation (Non-Medical): No  Physical Activity: Unknown (12/19/2022)   Exercise Vital Sign    Days of Exercise per Week: 0 days    Minutes of Exercise per Session: Not on file  Stress: Stress Concern Present (12/19/2022)   [employer] of Occupational Health - Occupational Stress Questionnaire    Feeling of Stress : To some extent  Social Connections: Socially Integrated (12/19/2022)   Social Connection and Isolation Panel    Frequency of Communication with Friends and Family: More than three times a week    Frequency of Social Gatherings with Friends and Family: More than three times a week    Attends Religious Services: More than 4 times per year    Active Member of Clubs or Organizations: Yes    Attends Banker Meetings: More than 4 times per year    Marital Status: Married  Catering Manager Violence: Not on file  Depression (PHQ2-9): Low Risk (12/23/2022)   Depression (PHQ2-9)    PHQ-2 Score: 0  Alcohol Screen: Low Risk (12/19/2022)   Alcohol Screen    Last Alcohol Screening Score (AUDIT): 3  Housing: Low Risk (12/19/2022)   Housing    Last Housing Risk Score: 0  Utilities: Not on file  Health Literacy: Not on file      Review of Systems  All other systems reviewed and are negative.      Objective:   Physical Exam Constitutional:      General: He is not in acute  distress.    Appearance: Normal appearance. He is well-developed and normal weight. He is not ill-appearing or toxic-appearing.  HENT:     Head: Normocephalic and atraumatic.     Right Ear: Tympanic membrane and ear canal normal.     Left Ear: Tympanic membrane and ear canal  normal.     Mouth/Throat:     Mouth: Mucous membranes are moist.     Pharynx: Oropharynx is clear. No oropharyngeal exudate or posterior oropharyngeal erythema.  Eyes:     General: No scleral icterus.       Right eye: No discharge.        Left eye: No discharge.     Extraocular Movements: Extraocular movements intact.     Conjunctiva/sclera: Conjunctivae normal.     Pupils: Pupils are equal, round, and reactive to light.  Neck:     Vascular: No carotid bruit.  Cardiovascular:     Rate and Rhythm: Normal rate and regular rhythm.     Pulses: Normal pulses.     Heart sounds: Normal heart sounds. No murmur heard.    No friction rub. No gallop.  Pulmonary:     Effort: Pulmonary effort is normal. No respiratory distress.     Breath sounds: Normal breath sounds. No stridor. No wheezing, rhonchi or rales.  Chest:     Chest wall: No tenderness.  Abdominal:     General: Bowel sounds are normal. There is no distension.     Palpations: Abdomen is soft. There is no mass.     Tenderness: There is no abdominal tenderness. There is no guarding or rebound.     Hernia: No hernia is present.  Musculoskeletal:        General: No deformity. Normal range of motion.     Cervical back: Normal range of motion and neck supple. No rigidity or tenderness.     Right lower leg: No edema.     Left lower leg: No edema.  Lymphadenopathy:     Cervical: No cervical adenopathy.  Skin:    General: Skin is warm.     Coloration: Skin is not jaundiced or pale.     Findings: No bruising, erythema, lesion or rash.  Neurological:     General: No focal deficit present.     Mental Status: He is alert and oriented to person, place, and time.  Mental status is at baseline.     Cranial Nerves: No cranial nerve deficit.     Sensory: No sensory deficit.     Motor: No weakness.     Coordination: Coordination normal.     Gait: Gait normal.     Deep Tendon Reflexes: Reflexes normal.  Psychiatric:        Mood and Affect: Mood normal.        Behavior: Behavior normal.        Thought Content: Thought content normal.        Judgment: Judgment normal.           Assessment & Plan:  General medical exam - Plan: CBC with Differential/Platelet, Comprehensive metabolic panel with GFR, Lipid panel, Microalbumin / creatinine urine ratio  Type 2 diabetes mellitus with hyperglycemia, with long-term current use of insulin  (HCC) - Plan: CBC with Differential/Platelet, Comprehensive metabolic panel with GFR, Lipid panel, Microalbumin / creatinine urine ratio  Attention deficit hyperactivity disorder (ADHD), predominantly inattentive type Blood pressure today is acceptable.  I will check a CBC a CMP a lipid panel and a microalbumin to creatinine ratio.  We will start the patient on Vyvanse 30 mg daily and see how the patient performs.  His hemoglobin A1c is performed at his endocrinologist office.  He declines a flu shot.  He declines a pneumonia vaccine.  He is not yet due for cancer screening.

## 2023-12-26 ENCOUNTER — Ambulatory Visit: Payer: Self-pay | Admitting: Family Medicine

## 2023-12-26 ENCOUNTER — Other Ambulatory Visit: Payer: Self-pay

## 2023-12-26 DIAGNOSIS — E663 Overweight: Secondary | ICD-10-CM

## 2023-12-26 DIAGNOSIS — E872 Acidosis, unspecified: Secondary | ICD-10-CM

## 2023-12-26 DIAGNOSIS — E139 Other specified diabetes mellitus without complications: Secondary | ICD-10-CM

## 2023-12-26 DIAGNOSIS — N289 Disorder of kidney and ureter, unspecified: Secondary | ICD-10-CM

## 2023-12-26 LAB — MICROALBUMIN / CREATININE URINE RATIO
Creatinine, Urine: 43 mg/dL (ref 20–320)
Microalb Creat Ratio: 160 mg/g{creat} — ABNORMAL HIGH (ref <30)
Microalb, Ur: 6.9 mg/dL

## 2023-12-26 LAB — CBC WITH DIFFERENTIAL/PLATELET
Absolute Lymphocytes: 1829 {cells}/uL (ref 850–3900)
Absolute Monocytes: 380 {cells}/uL (ref 200–950)
Basophils Absolute: 41 {cells}/uL (ref 0–200)
Basophils Relative: 0.6 %
Eosinophils Absolute: 228 {cells}/uL (ref 15–500)
Eosinophils Relative: 3.3 %
HCT: 48.1 % (ref 39.4–51.1)
Hemoglobin: 15.9 g/dL (ref 13.2–17.1)
MCH: 29.1 pg (ref 27.0–33.0)
MCHC: 33.1 g/dL (ref 31.6–35.4)
MCV: 87.9 fL (ref 81.4–101.7)
MPV: 10.3 fL (ref 7.5–12.5)
Monocytes Relative: 5.5 %
Neutro Abs: 4423 {cells}/uL (ref 1500–7800)
Neutrophils Relative %: 64.1 %
Platelets: 440 Thousand/uL — ABNORMAL HIGH (ref 140–400)
RBC: 5.47 Million/uL (ref 4.20–5.80)
RDW: 12.4 % (ref 11.0–15.0)
Total Lymphocyte: 26.5 %
WBC: 6.9 Thousand/uL (ref 3.8–10.8)

## 2023-12-26 LAB — COMPREHENSIVE METABOLIC PANEL WITH GFR
AG Ratio: 1.7 (calc) (ref 1.0–2.5)
ALT: 26 U/L (ref 9–46)
AST: 23 U/L (ref 10–40)
Albumin: 4.4 g/dL (ref 3.6–5.1)
Alkaline phosphatase (APISO): 73 U/L (ref 36–130)
BUN: 19 mg/dL (ref 7–25)
CO2: 28 mmol/L (ref 20–32)
Calcium: 9.7 mg/dL (ref 8.6–10.3)
Chloride: 99 mmol/L (ref 98–110)
Creat: 0.93 mg/dL (ref 0.60–1.26)
Globulin: 2.6 g/dL (ref 1.9–3.7)
Glucose, Bld: 252 mg/dL — ABNORMAL HIGH (ref 65–99)
Potassium: 4.6 mmol/L (ref 3.5–5.3)
Sodium: 137 mmol/L (ref 135–146)
Total Bilirubin: 0.5 mg/dL (ref 0.2–1.2)
Total Protein: 7 g/dL (ref 6.1–8.1)
eGFR: 108 mL/min/1.73m2 (ref >=60)

## 2023-12-26 LAB — LIPID PANEL
Cholesterol: 200 mg/dL — ABNORMAL HIGH (ref <200)
HDL: 50 mg/dL (ref >=40)
LDL Cholesterol (Calc): 124 mg/dL — ABNORMAL HIGH
Non-HDL Cholesterol (Calc): 150 mg/dL — ABNORMAL HIGH (ref <130)
Total CHOL/HDL Ratio: 4 (calc) (ref <5.0)
Triglycerides: 148 mg/dL (ref <150)

## 2023-12-26 MED ORDER — LISINOPRIL 20 MG PO TABS: 20.0000 mg | ORAL_TABLET | Freq: Every day | ORAL | 3 refills | Status: AC

## 2023-12-31 NOTE — Telephone Encounter (Unsigned)
 Copied from CRM [id number]. Topic: Clinical - Prescription Issue >> Dec 31, 2023 11:56 AM Avram MATSU wrote: Reason for CRM: patient stated his medication ws sent to the wrong pharmacy, lisdexamfetamine  (VYVANSE ) 30 MG capsule [[id number]]    [id number] [address], [place] - [address] [place] [address] Phone: [phone] Fax: [phone]

## 2024-01-01 MED ORDER — LISDEXAMFETAMINE DIMESYLATE 30 MG PO CAPS: 30.0000 mg | ORAL_CAPSULE | Freq: Every day | ORAL | 0 refills | Status: AC

## 2024-12-27 ENCOUNTER — Encounter: Admitting: Family Medicine
